# Patient Record
Sex: Male | Born: 1945 | Race: White | Hispanic: No | Marital: Married | Smoking: Former smoker
Health system: Southern US, Community
[De-identification: ages and names within clinical notes are randomized; demographics above are authoritative.]

## PROBLEM LIST (undated history)

## (undated) DIAGNOSIS — C4490 Unspecified malignant neoplasm of skin, unspecified: Secondary | ICD-10-CM

## (undated) DIAGNOSIS — I2510 Atherosclerotic heart disease of native coronary artery without angina pectoris: Secondary | ICD-10-CM

## (undated) DIAGNOSIS — E119 Type 2 diabetes mellitus without complications: Secondary | ICD-10-CM

## (undated) DIAGNOSIS — Z8522 Personal history of malignant neoplasm of nasal cavities, middle ear, and accessory sinuses: Secondary | ICD-10-CM

## (undated) DIAGNOSIS — I10 Essential (primary) hypertension: Secondary | ICD-10-CM

## (undated) HISTORY — DX: Type 2 diabetes mellitus without complications: E11.9

## (undated) HISTORY — DX: Unspecified malignant neoplasm of skin, unspecified: C44.90

## (undated) HISTORY — DX: Essential (primary) hypertension: I10

## (undated) HISTORY — DX: Atherosclerotic heart disease of native coronary artery without angina pectoris: I25.10

## (undated) HISTORY — DX: Personal history of malignant neoplasm of nasal cavities, middle ear, and accessory sinuses: Z85.22

---

## 1999-07-06 ENCOUNTER — Ambulatory Visit (HOSPITAL_COMMUNITY): Admission: RE | Admit: 1999-07-06 | Discharge: 1999-07-06 | Payer: Self-pay | Admitting: Cardiology

## 2002-01-06 ENCOUNTER — Encounter: Payer: Self-pay | Admitting: *Deleted

## 2002-01-06 ENCOUNTER — Ambulatory Visit (HOSPITAL_COMMUNITY): Admission: RE | Admit: 2002-01-06 | Discharge: 2002-01-06 | Payer: Self-pay | Admitting: *Deleted

## 2002-05-21 ENCOUNTER — Inpatient Hospital Stay (HOSPITAL_COMMUNITY): Admission: EM | Admit: 2002-05-21 | Discharge: 2002-05-26 | Payer: Self-pay | Admitting: Internal Medicine

## 2002-05-23 ENCOUNTER — Encounter: Payer: Self-pay | Admitting: Internal Medicine

## 2002-05-25 ENCOUNTER — Encounter (INDEPENDENT_AMBULATORY_CARE_PROVIDER_SITE_OTHER): Payer: Self-pay | Admitting: *Deleted

## 2007-05-02 ENCOUNTER — Encounter: Payer: Self-pay | Admitting: Orthopedic Surgery

## 2007-07-29 ENCOUNTER — Ambulatory Visit: Payer: Self-pay | Admitting: Orthopedic Surgery

## 2007-07-29 DIAGNOSIS — M25519 Pain in unspecified shoulder: Secondary | ICD-10-CM | POA: Insufficient documentation

## 2007-08-13 ENCOUNTER — Encounter: Payer: Self-pay | Admitting: Orthopedic Surgery

## 2007-08-17 ENCOUNTER — Ambulatory Visit (HOSPITAL_COMMUNITY): Admission: RE | Admit: 2007-08-17 | Discharge: 2007-08-17 | Payer: Self-pay | Admitting: Neurology

## 2007-09-10 ENCOUNTER — Telehealth: Payer: Self-pay | Admitting: Orthopedic Surgery

## 2010-07-20 NOTE — Op Note (Signed)
Kenneth Parsons, Kenneth Parsons                         ACCOUNT NO.:  [id number]   MEDICAL RECORD NO.:  [id number]                   PATIENT TYPE:  INP   LOCATION:  3739                                 FACILITY:  [facility]   PHYSICIAN:  Larina Earthly, M.D.                 DATE OF BIRTH:  Jan 06, 1946   DATE OF PROCEDURE:  05/25/2002  DATE OF DISCHARGE:                                 OPERATIVE REPORT   PREOPERATIVE DIAGNOSIS:  Severe symptomatic left internal carotid artery  stenosis.   POSTOPERATIVE DIAGNOSIS:  Severe symptomatic left internal carotid artery  stenosis.   OPERATION PERFORMED:  Left carotid endarterectomy and Dacron patch  angioplasty.   SURGEON:  Larina Earthly, M.D.   ASSISTANT:  Toribio Harbour, R.N.   ANESTHESIA:  General endotracheal.   COMPLICATIONS:  None.   DISPOSITION:  To recovery room stable.   DESCRIPTION OF PROCEDURE:  The patient was taken to the operating room and  placed in supine position where the area of the left neck was prepped and  draped in the usual sterile fashion.  An incision was made anterior to the  sternocleidomastoid muscle and carried down through the platysma with  electrocautery.  The sternocleidomastoid reflected posteriorly and the  carotid sheath was opened.  The facial vein was ligated with 2-0 silk ties  and divided.  The common carotid artery was encircled below the level of the  bifurcation with an umbilical tape and Rumel tourniquet.  Dissection was  extended onto the bifurcation and the superior thyroid artery was encircled  with a 2-0 silk Pott's tie.  The external carotid artery was encircled with  a blue vessel loop and the internal carotid artery encircled with an  umbilical tape and Rumel tourniquet.  The vagus and hypoglossal nerves were  identified and preserved.  The patient was given 7000 units of intravenous  heparin.  After adequate circulation time, the internal, external and common  carotid arteries were  occluded.  The common carotid artery was opened with  an 11 blade and extended longitudinally with Pott's scissors through the  plaque onto the internal carotid artery with Pott's scissors.  A 10 shunt  was passed up the internal carotid artery, allowed to backbleed and down the  common carotid artery where it was secured with Rumel tourniquets.  Endarterectomy was begun on the common carotid artery and the plaque was  divided proximally with Pott's scissors.  Endarterectomy extended onto the  bifurcation,  External carotid artery was endarterectomized with eversion  technique and the internal carotid artery was endarterectomized in an open  fashion.  The remaining atheromatous debris was removed from the  endarterectomy plane.  A Finesse Hemashield Dacron patch was brought onto  the field and was sewn as a patch angioplasty with a running 6-0 Prolene  suture.  Prior to completion of the anastomosis, the shunt was removed and  the  usual flushing maneuvers were undertaken.  The anastomosis was completed  and the external, followed by the common, followed by the internal carotid  artery occlusion clamp was removed.  Excellent flow characteristics were  noted with hand held Doppler in the internal and external carotid arteries.  The patient was given 15 mg of protamine to reverse the heparin.  The wounds  were irrigated with saline.  Hemostasis with electrocautery.  Wounds were  closed with 3-0 Vicryl sutures to reapproximate the sternocleidomastoid  muscle over the carotid sheath.  Next, the platysma was  closed with a running 3-0 Vicryl suture and finally, the skin was closed  with a 4-0 subcuticular Vicryl stitch.  Benzoin and Steri-Strips were  applied.  The patient was awakened in the operating room and returned to the  recovery room in stable condition.                                                Larina Earthly, M.D.    TFE/MEDQ  D:  05/25/2002  T:  05/25/2002  Job:  [id number]    cc:   Pricilla Riffle, M.D. [facility]

## 2010-07-20 NOTE — Cardiovascular Report (Signed)
   NAMEVIDAL, Kenneth Parsons                         ACCOUNT NO.:  [id number]   MEDICAL RECORD NO.:  [id number]                   PATIENT TYPE:  INP   LOCATION:  3739                                 FACILITY:  [facility]   PHYSICIAN:  Charlton Haws, M.D. [facility]              DATE OF BIRTH:  Aug 06, 1945   DATE OF PROCEDURE:  DATE OF DISCHARGE:                              CARDIAC CATHETERIZATION   PROCEDURE:  Coronary arteriography.   INDICATIONS FOR PROCEDURE:  Chest pain.  Rule out angina.   PROCEDURAL NOTE:  Standard catheterization was done from the right femoral  artery.   FINDINGS:  1. The left main coronary artery was normal  2. The left anterior descending artery had 30% proximal disease.  The mid     vessel had 50% to 60% diffuse disease, particularly at the takeoff of the     first diagonal branch. The first diagonal branch had 40% ostial lesion.     The distal LAD had multiple discrete lesions.  3. The circumflex coronary artery had 30% ostial lesion.  There was a 30%     obtuse marginal branch lesion.  4. The right coronary artery was dominant.  There was a 30% mid vessel     lesion and a 40% distal lesion before the takeoff of the PDA.  5. RAO ventriculography:  Normal.  EF was greater than  60%.  There was no     gradient across the aortic valve and no MR.  6. The aortic pressure was in the range of 180/103.  LV pressure was 180/19.   IMPRESSION:  The patient should have continued medical therapy.  He has had  stable symptoms for over a year.  He will need an outpatient Cardiolite to  get a baseline in regards to his left anterior descending artery disease.   I would maximize medical therapy.  Clearly, he needs better blood pressure  control.   If his leg heals well, he will be discharged in the morning.                                               Charlton Haws, M.D. [facility]    PN/MEDQ  D:  05/24/2002  T:  05/24/2002  Job:  [id number]   cc:   Annette Stable Hasanaj  [address]  [place]  [place] [address]  Fax: [phone]   [facility]   Jonelle Sidle, M.D. [facility]   Willa Rough, M.D. [facility]

## 2010-07-20 NOTE — Cardiovascular Report (Signed)
. [facility]  Patient:    Kenneth Parsons, Kenneth Parsons                      MRN: [id number] Proc. Date: 07/06/99 Adm. Date:  [id number] Disc. Date: [id number] Attending:  Learta Codding CC:         Kenneth Parsons, M.D., Kenneth Parsons, M.D.             Kenneth Parsons Kenneth Parsons, M.D. [facility]             Rollene Rotunda, M.D. [facility]                        Cardiac Catheterization  REFERRING PHYSICIAN:  Lia Parsons, M.D.  CARDIOLOGIST:  Kenneth Parsons. Kenneth Parsons, M.D. [facility]  DATE OF BIRTH:  11-16-1945.  PROCEDURES PERFORMED: 1. Left heart catheterization. 2. Ventriculography.  DIAGNOSIS: 1. Nonobstructive coronary artery disease. 2. Normal left ventricular systolic function.  HISTORY:  Kenneth Parsons is a 65 year old white male followed by Dr. Antoine Parsons and Dr. Jens Parsons.  The patient has also a history of hypertension, hyperlipidemia, and diabetes mellitus.  The patient has been recently evaluated for increased substernal chest pain.  He underwent stress ______ on Jul 04, 1999.  He experienced chest tightness during this study and had one ______ ST depression in inferolateral leads.  Scintigraphic images, however, showed normal perfusion.  His ejection fraction was 62%.  However, due to continued pain and the fact that the patient has had positive EKG changes, he has been referred for a diagnostic cardiac catheterization.  DESCRIPTION OF PROCEDURE:  After informed consent was obtained, the patient was brought to the catheterization laboratory.  The right groin was sterilely prepped and draped.  Lidocaine, 1%, was used to infiltrate the right groin.  A 6-French arterial sheath was placed using a modified Seldinger technique. Subsequently, 6-French JL4 and JR4 catheters were used to engage the left and right coronary arteries.  Selective angiography was performed in various projections using manual injections of contrast.  After selective angiography, a  ventriculography was performed.  A 6-French pigtail catheter was placed in the left ventricle.  Appropriate left-sided hemodynamics were obtained.  A ventriculogram was obtained using power injections of contrast.  The pigtail catheter was then removed and pulled back.  At the termination of the case, the catheter and sheath were removed and manual pressure applied until adequate hemostasis was achieved.  The patient tolerated the procedure well and was transferred to the day hospital in stable condition.  FINDINGS:  Hemodynamics:  Left ventricular pressure 140/11 mmHg.  Aortic pressure 140/71 mmHg.  Ventriculography performed in single plane RAO projection:  Left ventricular ejection fraction of 65% without wall motion abnormalities.  Selective angiography: 1. The left main coronary artery is a large caliber vessel with no    flow-limiting coronary artery disease. 2. The left anterior descending artery is only a moderate sized caliber vessel    with some diffuse plaquing estimated at 20-30% in its proximal segment of    the vessel.  Mid and distal vessel is free of disease. 3. The left circumflex coronary artery has approximately a proximal 20%    stenosis with a diffuse irregularity.  There is no other flow-limiting    coronary artery disease in this distribution. 4. The right coronary artery has a 20-30% distal area of diffuse plaquing  but    otherwise no flow-limiting coronary artery disease.  CONCLUSION: 1. Nonobstructive mild coronary artery disease. 2. Normal left ventricular systolic function.  RECOMMENDATIONS:  The results have been discussed with the patient and his family.  Based on the scintigraphic images as well as the current findings by cardiac catheterization, there is no indication for surgical or percutaneous revascularization.  However, the patient does have minor plaquing in multiple distributions as outlined above, and given his risk factor profile,  the patient will need aggressive medical therapy with lipid-lowering as well as aggressive risk factor modification.  The results have been forwarded to Dr. Olena Leatherwood. DD:  07/06/99 TD:  07/08/99 Job: [id number]

## 2010-07-20 NOTE — Discharge Summary (Signed)
NAMEHURMAN, KETELSEN                         ACCOUNT NO.:  [id number]   MEDICAL RECORD NO.:  [id number]                   PATIENT TYPE:  INP   LOCATION:  3306                                 FACILITY:  [facility]   PHYSICIAN:  Jonelle Sidle, M.D. [facility]        DATE OF BIRTH:  Aug 12, 1945   DATE OF ADMISSION:  05/21/2002  DATE OF DISCHARGE:  05/26/2002                           DISCHARGE SUMMARY - REFERRING   PROCEDURES:  1. Coronary angiogram on May 24, 2002.  2. Left carotid endarterectomy on May 25, 2002.   HISTORY OF PRESENT ILLNESS:  The patient is a 65 year old male with a prior  history of nonobstructive coronary disease, who was recently referred to  Willa Rough, M.D., at our [place], cardiology clinic for  evaluation of an abnormal routine treadmill test.  Given the patient's  complaint of chest discomfort in the setting of multiple cardiac risk  factors, arrangements were made for an elective coronary angiogram.  In the  interim, he was referred for carotid Dopplers for evaluation of carotid  bruits.  These revealed high-grade left ICA lesion (80-99%).  Following  initiation of intravenous heparin, arrangements were made for direct  transfer from [facility] to [facility] for  surgical evaluation preceded by diagnostic coronary angiography.   LABORATORY DATA:  Electrolytes and renal function remained within normal  limits with discharge levels of sodium 134, potassium 3.9, glucose 156, BUN  14, and creatinine 0.9.  WBC 13.8, HGB 12.4, platelets 263 at discharge.   Chest x-ray:  Small bilateral pulmonary nodule - question old granulomatous  disease; consider follow-up chest CT to exclude neoplasm.   HOSPITAL COURSE:  Following transfer from [facility], the patient was  started on intravenous heparin for treatment of high-grade left ICA stenosis  with evidence of soft plaque/thrombus by ultrasonography.  He was evaluated  by Larina Earthly, M.D., with initial recommendation to repeat the  ultrasound.  This revealed greater than 80% ICA stenosis (on high end of  scale) with greater than 80% left ECA stenosis as well; no right ICA  stenosis.  The recommendation was to proceed with carotid endarterectomy.   This was preceded, however, by diagnostic coronary angiogram on May 24, 2002, by Charlton Haws, M.D. (see report for full details), revealing  nonobstructive coronary artery disease with normal left ventricular  function.  Specifically, there was normal LMCA; 30%  proximal; 50-60% mid,  30% distal LAD; 40% ostial diagonal 1; 30% proximal CFX; 30% OM1; 30% mid,  40% distal RCA.   Of note, the patient did have elevated blood pressure (180/103) during  procedure, requiring treatment with labetalol and intravenous Vasotec.   Charlton Haws, M.D., recommended medical therapy with consideration for  outpatient Cardiolite.   The patient was then referred for carotid endarterectomy performed on May 25, 2002, by Larina Earthly, M.D., with no noted complications.  He was kept  for overnight  observation and cleared for discharge the following morning.   The patient had no complaint of chest pain or dyspnea and was  hemodynamically stable and afebrile.  Both the left CEA and right groin  incision sites appeared stable.   Given the finding of small bilateral pulmonary nodules by routine chest x-  ray, the recommendation is to proceed with a follow-up outpatient chest CT  scan.  The patient has been made aware of this and scheduling arrangements  will be made through the [place], [place], office.   Medication adjustments this admission:  Up titration of Monopril to 10 mg  daily.   DISCHARGE MEDICATIONS:  1. Coated aspirin 325 mg daily.  2. Lipitor 20 mg daily.  3. Prozac 20 mg daily.  4. Monopril 10 mg daily.  5. Glucovance 2.5/500 mg b.i.d.  6. Nitrostat 0.4 mg as directed.   ACTIVITY:  The patient  is to refrain from any strenuous activity, driving,  or return to work until seen by his physician.   WOUND CARE:  He is to call if there is any swelling or bleeding from the  incision sites.   FOLLOW-UP:  The patient is scheduled to follow up with Willa Rough, M.D.,  at the [place], [facility], office on Thursday, June 03, 2002, at 12:15  p.m.  This will be preceded by a chest CT scan with arrangements made by our  office.   The patient is scheduled to follow up with Larina Earthly, M.D., on June 10, 2002, at 1:30 p.m.   DISCHARGE DIAGNOSES:  1. Severe left internal carotid artery stenosis.     a. Status post carotid endarterectomy on May 25, 2002.  2. Nonobstructive coronary artery disease, normal left ventricular function.     a. Coronary angiogram on May 24, 2002.     b. Abnormal routine treadmill test on May 20, 2002.     c. Minimal coronary artery disease on coronary angiogram in May of 2001.  3. Abnormal chest x-ray on May 10, 2002.  Follow-up chest CT scan     recommended.  4. Hypertension.  5. Type 2 diabetes mellitus.  6. Dyslipidemia.  7. History of melanoma.     Gene Serpe, P.A. [facility]                      Jonelle Sidle, M.D. [facility]    GS/MEDQ  D:  05/26/2002  T:  05/26/2002  Job:  [id number]   cc:   Annette Stable Hasanaj  [address]  [place]  [place] [address]  Fax: [phone]   Larina Earthly, M.D.  [address]  [place]  [place] [address]  Fax: [phone]   [facility]  [address]  [address]  [place], [place] [address]

## 2011-04-12 DIAGNOSIS — E538 Deficiency of other specified B group vitamins: Secondary | ICD-10-CM | POA: Diagnosis not present

## 2011-04-12 DIAGNOSIS — E119 Type 2 diabetes mellitus without complications: Secondary | ICD-10-CM | POA: Diagnosis not present

## 2011-04-12 DIAGNOSIS — D518 Other vitamin B12 deficiency anemias: Secondary | ICD-10-CM | POA: Diagnosis not present

## 2011-04-12 DIAGNOSIS — I10 Essential (primary) hypertension: Secondary | ICD-10-CM | POA: Diagnosis not present

## 2011-04-12 DIAGNOSIS — E785 Hyperlipidemia, unspecified: Secondary | ICD-10-CM | POA: Diagnosis not present

## 2011-05-28 DIAGNOSIS — E538 Deficiency of other specified B group vitamins: Secondary | ICD-10-CM | POA: Diagnosis not present

## 2011-06-12 DIAGNOSIS — C311 Malignant neoplasm of ethmoidal sinus: Secondary | ICD-10-CM | POA: Diagnosis not present

## 2011-07-08 DIAGNOSIS — M5030 Other cervical disc degeneration, unspecified cervical region: Secondary | ICD-10-CM | POA: Diagnosis not present

## 2011-07-08 DIAGNOSIS — M542 Cervicalgia: Secondary | ICD-10-CM | POA: Diagnosis not present

## 2011-08-07 ENCOUNTER — Other Ambulatory Visit: Payer: Self-pay | Admitting: Dermatology

## 2011-08-07 DIAGNOSIS — D0430 Carcinoma in situ of skin of unspecified part of face: Secondary | ICD-10-CM | POA: Diagnosis not present

## 2011-08-07 DIAGNOSIS — D485 Neoplasm of uncertain behavior of skin: Secondary | ICD-10-CM | POA: Diagnosis not present

## 2011-08-07 DIAGNOSIS — D2370 Other benign neoplasm of skin of unspecified lower limb, including hip: Secondary | ICD-10-CM | POA: Diagnosis not present

## 2011-08-07 DIAGNOSIS — L570 Actinic keratosis: Secondary | ICD-10-CM | POA: Diagnosis not present

## 2011-10-28 DIAGNOSIS — D0460 Carcinoma in situ of skin of unspecified upper limb, including shoulder: Secondary | ICD-10-CM | POA: Diagnosis not present

## 2011-10-28 DIAGNOSIS — D0430 Carcinoma in situ of skin of unspecified part of face: Secondary | ICD-10-CM | POA: Diagnosis not present

## 2011-11-11 DIAGNOSIS — Z9889 Other specified postprocedural states: Secondary | ICD-10-CM | POA: Diagnosis not present

## 2011-11-11 DIAGNOSIS — Z923 Personal history of irradiation: Secondary | ICD-10-CM | POA: Diagnosis not present

## 2011-11-11 DIAGNOSIS — Z8522 Personal history of malignant neoplasm of nasal cavities, middle ear, and accessory sinuses: Secondary | ICD-10-CM | POA: Diagnosis not present

## 2011-11-11 DIAGNOSIS — C311 Malignant neoplasm of ethmoidal sinus: Secondary | ICD-10-CM | POA: Diagnosis not present

## 2011-11-11 DIAGNOSIS — Z9221 Personal history of antineoplastic chemotherapy: Secondary | ICD-10-CM | POA: Diagnosis not present

## 2011-11-19 DIAGNOSIS — Z23 Encounter for immunization: Secondary | ICD-10-CM | POA: Diagnosis not present

## 2011-11-21 DIAGNOSIS — I10 Essential (primary) hypertension: Secondary | ICD-10-CM | POA: Diagnosis not present

## 2011-11-21 DIAGNOSIS — E785 Hyperlipidemia, unspecified: Secondary | ICD-10-CM | POA: Diagnosis not present

## 2011-11-21 DIAGNOSIS — E538 Deficiency of other specified B group vitamins: Secondary | ICD-10-CM | POA: Diagnosis not present

## 2011-11-21 DIAGNOSIS — IMO0001 Reserved for inherently not codable concepts without codable children: Secondary | ICD-10-CM | POA: Diagnosis not present

## 2011-11-28 ENCOUNTER — Other Ambulatory Visit (HOSPITAL_COMMUNITY): Payer: Self-pay | Admitting: Internal Medicine

## 2011-11-28 DIAGNOSIS — Z9889 Other specified postprocedural states: Secondary | ICD-10-CM

## 2011-12-02 ENCOUNTER — Ambulatory Visit (HOSPITAL_COMMUNITY)
Admission: RE | Admit: 2011-12-02 | Discharge: 2011-12-02 | Disposition: A | Discharge status: Home / Self Care | Payer: Medicare Other | Source: Ambulatory Visit | Attending: Internal Medicine | Admitting: Internal Medicine

## 2011-12-02 DIAGNOSIS — Z87891 Personal history of nicotine dependence: Secondary | ICD-10-CM | POA: Insufficient documentation

## 2011-12-02 DIAGNOSIS — Z9889 Other specified postprocedural states: Secondary | ICD-10-CM

## 2011-12-02 DIAGNOSIS — I6529 Occlusion and stenosis of unspecified carotid artery: Secondary | ICD-10-CM | POA: Diagnosis not present

## 2011-12-02 DIAGNOSIS — I10 Essential (primary) hypertension: Secondary | ICD-10-CM | POA: Diagnosis not present

## 2011-12-02 DIAGNOSIS — E119 Type 2 diabetes mellitus without complications: Secondary | ICD-10-CM | POA: Diagnosis not present

## 2011-12-05 ENCOUNTER — Other Ambulatory Visit: Payer: Self-pay | Admitting: Dermatology

## 2011-12-05 DIAGNOSIS — C4370 Malignant melanoma of unspecified lower limb, including hip: Secondary | ICD-10-CM | POA: Diagnosis not present

## 2011-12-05 DIAGNOSIS — D485 Neoplasm of uncertain behavior of skin: Secondary | ICD-10-CM | POA: Diagnosis not present

## 2011-12-19 DIAGNOSIS — L089 Local infection of the skin and subcutaneous tissue, unspecified: Secondary | ICD-10-CM | POA: Diagnosis not present

## 2011-12-19 DIAGNOSIS — A499 Bacterial infection, unspecified: Secondary | ICD-10-CM | POA: Diagnosis not present

## 2012-02-20 DIAGNOSIS — I10 Essential (primary) hypertension: Secondary | ICD-10-CM | POA: Diagnosis not present

## 2012-02-20 DIAGNOSIS — N400 Enlarged prostate without lower urinary tract symptoms: Secondary | ICD-10-CM | POA: Diagnosis not present

## 2012-02-20 DIAGNOSIS — E119 Type 2 diabetes mellitus without complications: Secondary | ICD-10-CM | POA: Diagnosis not present

## 2012-02-20 DIAGNOSIS — E785 Hyperlipidemia, unspecified: Secondary | ICD-10-CM | POA: Diagnosis not present

## 2012-02-20 DIAGNOSIS — B029 Zoster without complications: Secondary | ICD-10-CM | POA: Diagnosis not present

## 2012-05-13 DIAGNOSIS — Z8522 Personal history of malignant neoplasm of nasal cavities, middle ear, and accessory sinuses: Secondary | ICD-10-CM | POA: Diagnosis not present

## 2012-05-13 DIAGNOSIS — C311 Malignant neoplasm of ethmoidal sinus: Secondary | ICD-10-CM | POA: Diagnosis not present

## 2012-06-01 DIAGNOSIS — Z23 Encounter for immunization: Secondary | ICD-10-CM | POA: Diagnosis not present

## 2012-07-01 DIAGNOSIS — E785 Hyperlipidemia, unspecified: Secondary | ICD-10-CM | POA: Diagnosis not present

## 2012-07-01 DIAGNOSIS — I10 Essential (primary) hypertension: Secondary | ICD-10-CM | POA: Diagnosis not present

## 2012-07-01 DIAGNOSIS — Z0000 Encounter for general adult medical examination without abnormal findings: Secondary | ICD-10-CM | POA: Diagnosis not present

## 2012-07-01 DIAGNOSIS — E119 Type 2 diabetes mellitus without complications: Secondary | ICD-10-CM | POA: Diagnosis not present

## 2012-07-01 DIAGNOSIS — IMO0001 Reserved for inherently not codable concepts without codable children: Secondary | ICD-10-CM | POA: Diagnosis not present

## 2012-07-21 DIAGNOSIS — H5200 Hypermetropia, unspecified eye: Secondary | ICD-10-CM | POA: Diagnosis not present

## 2012-07-21 DIAGNOSIS — E119 Type 2 diabetes mellitus without complications: Secondary | ICD-10-CM | POA: Diagnosis not present

## 2012-08-20 DIAGNOSIS — F329 Major depressive disorder, single episode, unspecified: Secondary | ICD-10-CM | POA: Diagnosis not present

## 2012-08-20 DIAGNOSIS — M542 Cervicalgia: Secondary | ICD-10-CM | POA: Diagnosis not present

## 2012-09-30 DIAGNOSIS — E785 Hyperlipidemia, unspecified: Secondary | ICD-10-CM | POA: Diagnosis not present

## 2012-09-30 DIAGNOSIS — E119 Type 2 diabetes mellitus without complications: Secondary | ICD-10-CM | POA: Diagnosis not present

## 2012-09-30 DIAGNOSIS — I10 Essential (primary) hypertension: Secondary | ICD-10-CM | POA: Diagnosis not present

## 2012-09-30 DIAGNOSIS — R42 Dizziness and giddiness: Secondary | ICD-10-CM | POA: Diagnosis not present

## 2012-09-30 DIAGNOSIS — IMO0001 Reserved for inherently not codable concepts without codable children: Secondary | ICD-10-CM | POA: Diagnosis not present

## 2012-11-11 DIAGNOSIS — Z23 Encounter for immunization: Secondary | ICD-10-CM | POA: Diagnosis not present

## 2012-11-24 ENCOUNTER — Other Ambulatory Visit: Payer: Self-pay | Admitting: Dermatology

## 2012-11-24 DIAGNOSIS — C44621 Squamous cell carcinoma of skin of unspecified upper limb, including shoulder: Secondary | ICD-10-CM | POA: Diagnosis not present

## 2012-11-24 DIAGNOSIS — L570 Actinic keratosis: Secondary | ICD-10-CM | POA: Diagnosis not present

## 2012-11-24 DIAGNOSIS — D485 Neoplasm of uncertain behavior of skin: Secondary | ICD-10-CM | POA: Diagnosis not present

## 2013-01-06 DIAGNOSIS — I10 Essential (primary) hypertension: Secondary | ICD-10-CM | POA: Diagnosis not present

## 2013-01-06 DIAGNOSIS — Z125 Encounter for screening for malignant neoplasm of prostate: Secondary | ICD-10-CM | POA: Diagnosis not present

## 2013-01-06 DIAGNOSIS — E785 Hyperlipidemia, unspecified: Secondary | ICD-10-CM | POA: Diagnosis not present

## 2013-01-06 DIAGNOSIS — E119 Type 2 diabetes mellitus without complications: Secondary | ICD-10-CM | POA: Diagnosis not present

## 2013-01-13 DIAGNOSIS — E785 Hyperlipidemia, unspecified: Secondary | ICD-10-CM | POA: Diagnosis not present

## 2013-01-13 DIAGNOSIS — L570 Actinic keratosis: Secondary | ICD-10-CM | POA: Diagnosis not present

## 2013-01-13 DIAGNOSIS — IMO0001 Reserved for inherently not codable concepts without codable children: Secondary | ICD-10-CM | POA: Diagnosis not present

## 2013-01-13 DIAGNOSIS — I10 Essential (primary) hypertension: Secondary | ICD-10-CM | POA: Diagnosis not present

## 2013-04-07 DIAGNOSIS — E119 Type 2 diabetes mellitus without complications: Secondary | ICD-10-CM | POA: Diagnosis not present

## 2013-04-07 DIAGNOSIS — I10 Essential (primary) hypertension: Secondary | ICD-10-CM | POA: Diagnosis not present

## 2013-04-07 DIAGNOSIS — E785 Hyperlipidemia, unspecified: Secondary | ICD-10-CM | POA: Diagnosis not present

## 2013-05-10 DIAGNOSIS — IMO0001 Reserved for inherently not codable concepts without codable children: Secondary | ICD-10-CM | POA: Diagnosis not present

## 2013-05-10 DIAGNOSIS — I10 Essential (primary) hypertension: Secondary | ICD-10-CM | POA: Diagnosis not present

## 2013-05-10 DIAGNOSIS — E785 Hyperlipidemia, unspecified: Secondary | ICD-10-CM | POA: Diagnosis not present

## 2013-08-14 IMAGING — US US CAROTID DUPLEX BILAT
1 series · 13 of 24 positions shown · non-contrast
Comparison: None available

CLINICAL DATA: Previous left carotid endarterectomy.  Hypertension,
diabetes, previous tobacco use.

BILATERAL CAROTID DUPLEX ULTRASOUND
TECHNIQUE: Gray scale imaging, color Doppler and duplex ultrasound
was performed of bilateral carotid and vertebral arteries in the
neck.

[Series 1: us carotid duplex bilat · 0.06mm/px · 13 of 78 slices shown]
[im 1/78]
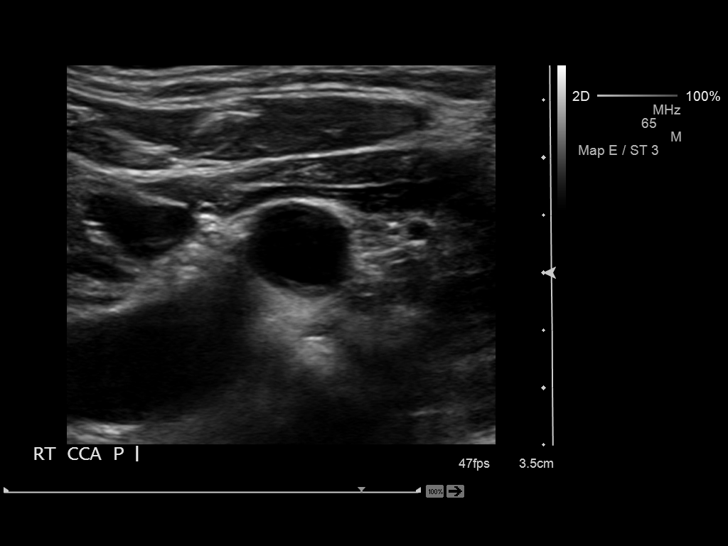
[im 7/78]
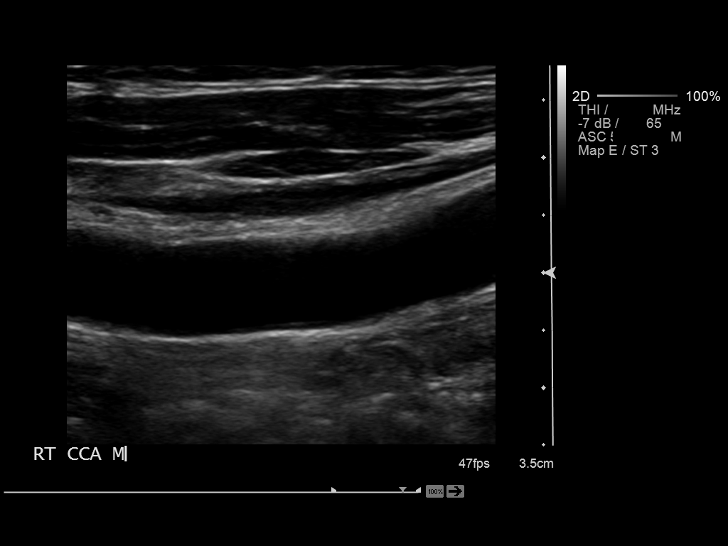
[im 14/78]
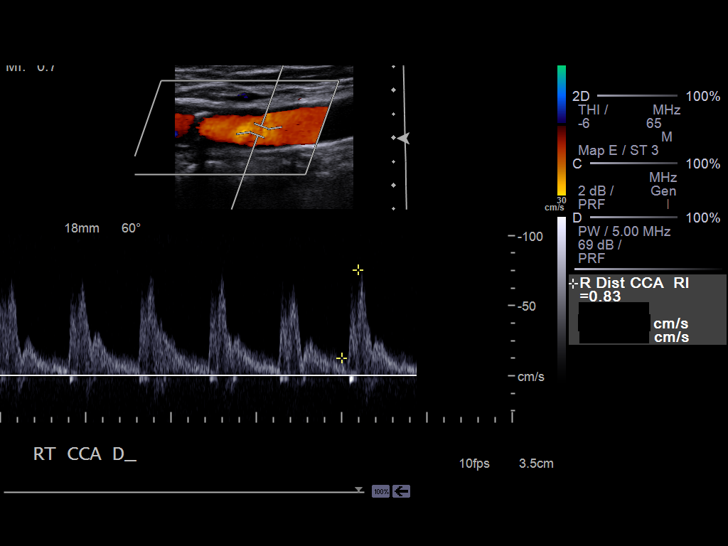
[im 21/78]
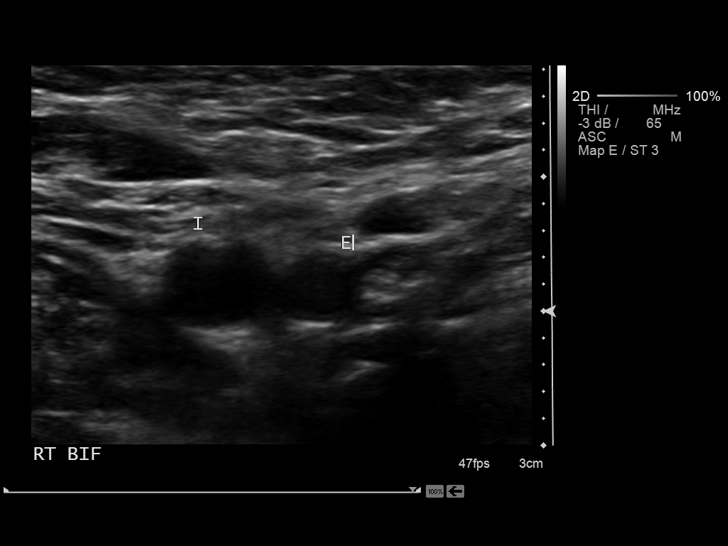
[im 27/78]
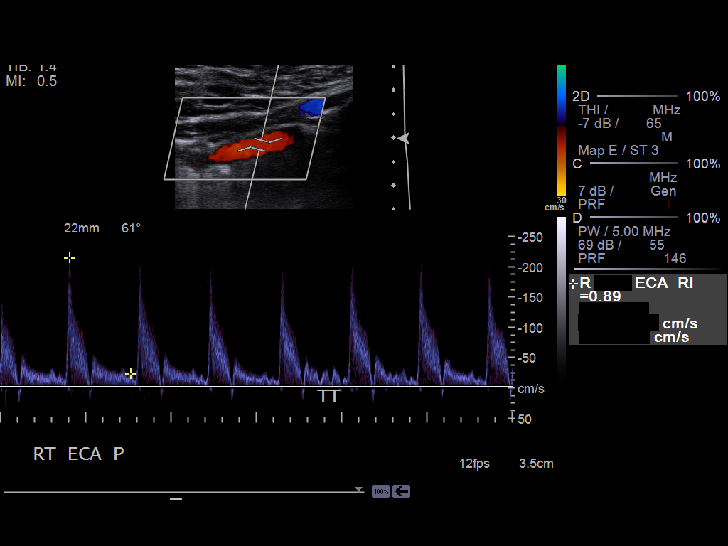
[im 34/78]
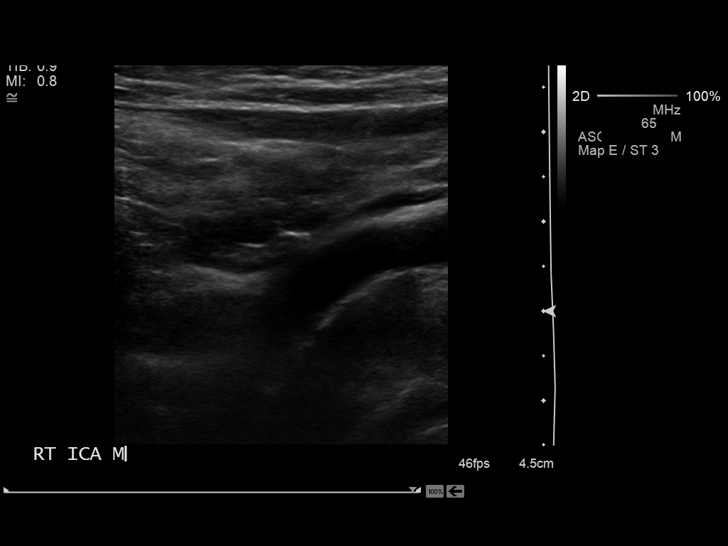
[im 41/78]
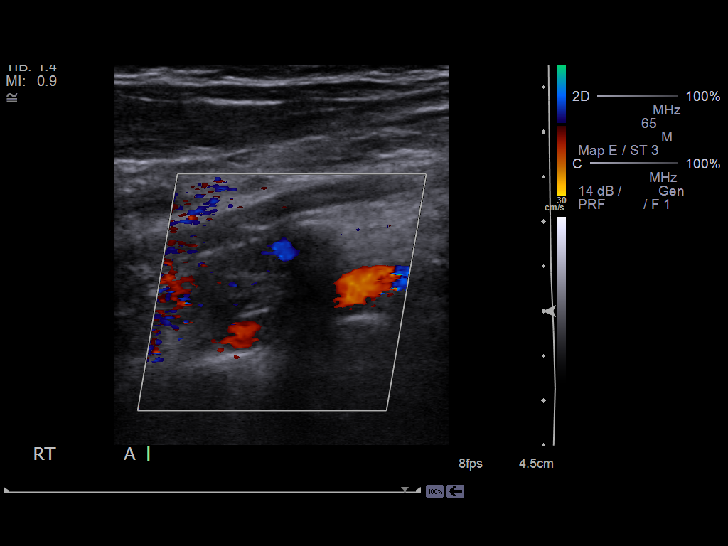
[im 44/78]
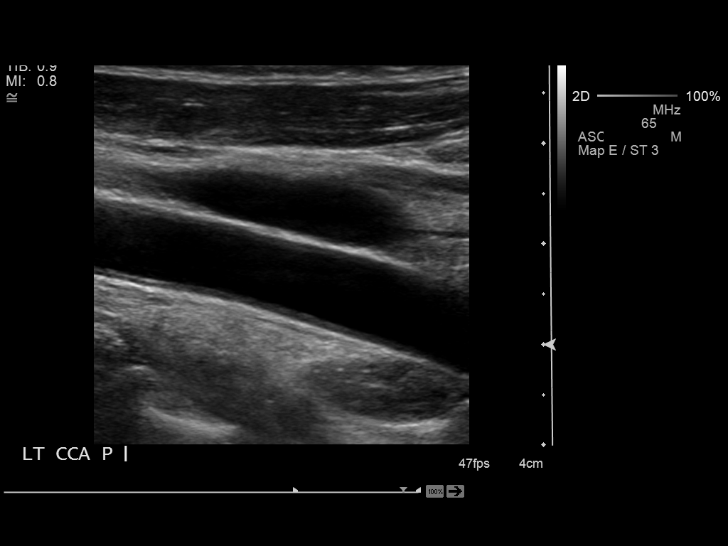
[im 51/78]
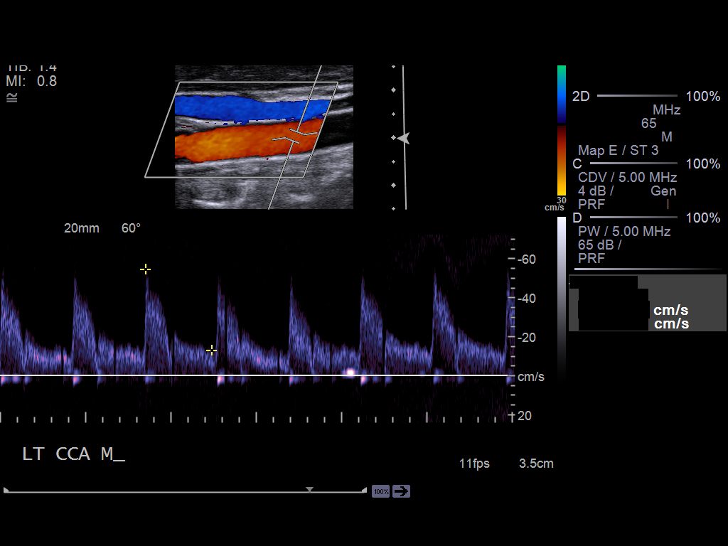
[im 57/78]
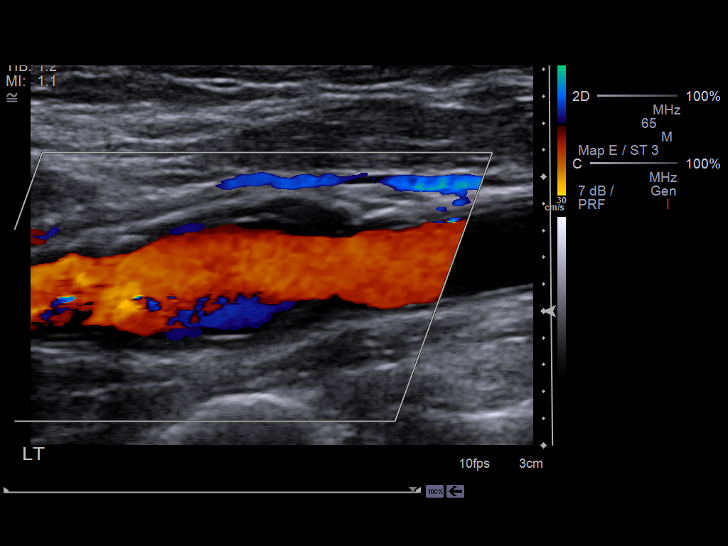
[im 64/78]
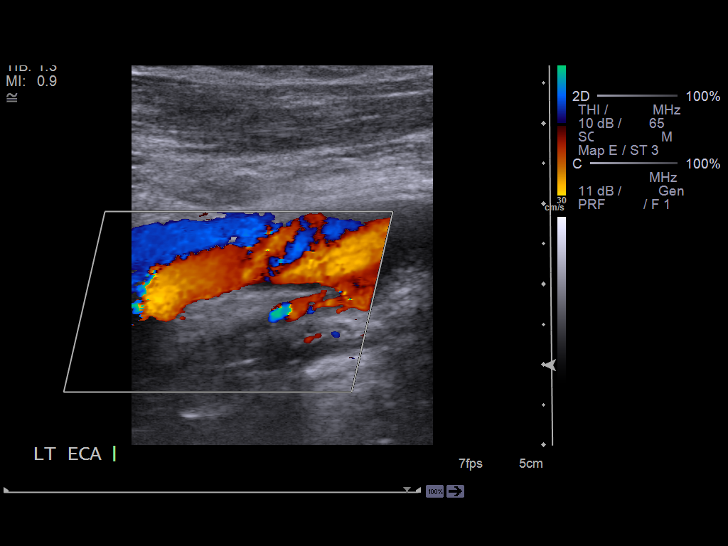
[im 71/78]
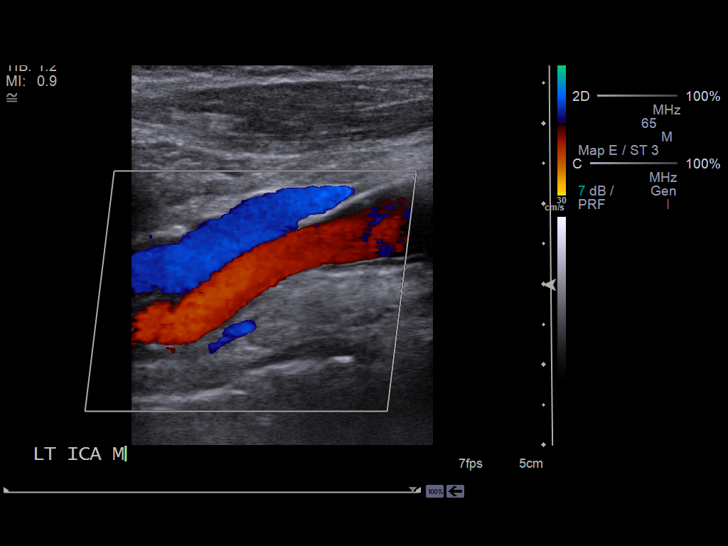
[im 78/78]
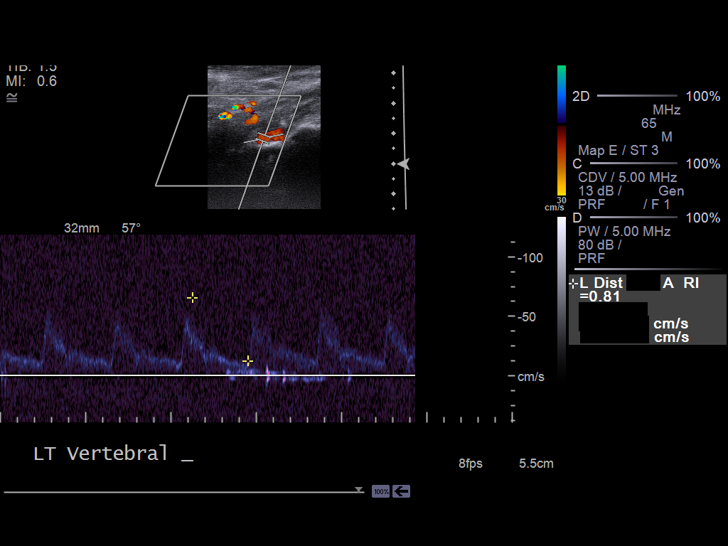

[13 of 24 positions shown; findings below may reference images not displayed]

Criteria:  Quantification of carotid stenosis is based on velocity
parameters that correlate the residual internal carotid diameter
with NASCET-based stenosis levels, using the diameter of the distal
internal carotid lumen as the denominator for stenosis measurement.

The following velocity measurements were obtained:

                 PEAK SYSTOLIC/END DIASTOLIC
RIGHT
ICA:                        89/23cm/sec
CCA:                        87/14cm/sec
SYSTOLIC ICA/CCA RATIO:
DIASTOLIC ICA/CCA RATIO:
ECA:                        284cm/sec

LEFT
ICA:                        79/15cm/sec
CCA:                        57/17cm/sec
SYSTOLIC ICA/CCA RATIO:
DIASTOLIC ICA/CCA RATIO:
ECA:                        78cm/sec
FINDINGS: RIGHT CAROTID ARTERY: There is mild smooth noncalcified plaque in
the common carotid artery.  There is circumferential somewhat
irregular and partially calcified plaque in the carotid bulb
extending to the proximal ICA.  Plaque extends to the origin of the
external carotid artery with mildly elevated peak systolic
velocities.  Normal wave forms and color Doppler signal.

RIGHT VERTEBRAL ARTERY:  Normal flow direction and waveform.

LEFT CAROTID ARTERY: Mild intimal thickening in the common carotid
artery.  The carotid bulb appears capacious with mild somewhat
irregular wall thickening extending to the proximal ICA.  No focal
plaque accumulation or stenosis.  Normal wave forms and color
Doppler signal.

LEFT VERTEBRAL ARTERY:  Normal flow direction and waveform.
IMPRESSION: 1.  Mild right carotid bifurcation plaque resulting in less than
50% diameter stenosis. The exam does not exclude plaque ulceration
or embolization.  Continued surveillance recommended.

2.  Changes of left carotid endarterectomy without apparent
complication or significant residual/recurrent stenosis.

## 2013-09-28 DIAGNOSIS — E785 Hyperlipidemia, unspecified: Secondary | ICD-10-CM | POA: Diagnosis not present

## 2013-09-28 DIAGNOSIS — R0602 Shortness of breath: Secondary | ICD-10-CM | POA: Diagnosis not present

## 2013-09-28 DIAGNOSIS — I10 Essential (primary) hypertension: Secondary | ICD-10-CM | POA: Diagnosis not present

## 2013-09-28 DIAGNOSIS — E119 Type 2 diabetes mellitus without complications: Secondary | ICD-10-CM | POA: Diagnosis not present

## 2013-12-22 DIAGNOSIS — Z8522 Personal history of malignant neoplasm of nasal cavities, middle ear, and accessory sinuses: Secondary | ICD-10-CM | POA: Diagnosis not present

## 2013-12-22 DIAGNOSIS — Z8520 Personal history of malignant neoplasm of unspecified respiratory organ: Secondary | ICD-10-CM | POA: Diagnosis not present

## 2013-12-23 DIAGNOSIS — Z23 Encounter for immunization: Secondary | ICD-10-CM | POA: Diagnosis not present

## 2014-01-10 DIAGNOSIS — Z1289 Encounter for screening for malignant neoplasm of other sites: Secondary | ICD-10-CM | POA: Diagnosis not present

## 2014-01-10 DIAGNOSIS — C311 Malignant neoplasm of ethmoidal sinus: Secondary | ICD-10-CM | POA: Diagnosis not present

## 2014-01-10 DIAGNOSIS — Z0389 Encounter for observation for other suspected diseases and conditions ruled out: Secondary | ICD-10-CM | POA: Diagnosis not present

## 2014-02-02 DIAGNOSIS — I10 Essential (primary) hypertension: Secondary | ICD-10-CM | POA: Diagnosis not present

## 2014-02-02 DIAGNOSIS — E119 Type 2 diabetes mellitus without complications: Secondary | ICD-10-CM | POA: Diagnosis not present

## 2014-02-02 DIAGNOSIS — E1165 Type 2 diabetes mellitus with hyperglycemia: Secondary | ICD-10-CM | POA: Diagnosis not present

## 2014-02-02 DIAGNOSIS — E785 Hyperlipidemia, unspecified: Secondary | ICD-10-CM | POA: Diagnosis not present

## 2014-02-02 DIAGNOSIS — I2510 Atherosclerotic heart disease of native coronary artery without angina pectoris: Secondary | ICD-10-CM | POA: Diagnosis not present

## 2014-04-08 DIAGNOSIS — R21 Rash and other nonspecific skin eruption: Secondary | ICD-10-CM | POA: Diagnosis not present

## 2014-04-08 DIAGNOSIS — D235 Other benign neoplasm of skin of trunk: Secondary | ICD-10-CM | POA: Diagnosis not present

## 2014-04-08 DIAGNOSIS — D225 Melanocytic nevi of trunk: Secondary | ICD-10-CM | POA: Diagnosis not present

## 2014-04-08 DIAGNOSIS — D2272 Melanocytic nevi of left lower limb, including hip: Secondary | ICD-10-CM | POA: Diagnosis not present

## 2014-06-16 DIAGNOSIS — E780 Pure hypercholesterolemia: Secondary | ICD-10-CM | POA: Diagnosis not present

## 2014-06-16 DIAGNOSIS — I10 Essential (primary) hypertension: Secondary | ICD-10-CM | POA: Diagnosis not present

## 2014-06-16 DIAGNOSIS — Z125 Encounter for screening for malignant neoplasm of prostate: Secondary | ICD-10-CM | POA: Diagnosis not present

## 2014-06-16 DIAGNOSIS — C439 Malignant melanoma of skin, unspecified: Secondary | ICD-10-CM | POA: Diagnosis not present

## 2014-06-16 DIAGNOSIS — C319 Malignant neoplasm of accessory sinus, unspecified: Secondary | ICD-10-CM | POA: Diagnosis not present

## 2014-06-16 DIAGNOSIS — Z1211 Encounter for screening for malignant neoplasm of colon: Secondary | ICD-10-CM | POA: Diagnosis not present

## 2014-06-16 DIAGNOSIS — E119 Type 2 diabetes mellitus without complications: Secondary | ICD-10-CM | POA: Diagnosis not present

## 2014-06-29 DIAGNOSIS — Z125 Encounter for screening for malignant neoplasm of prostate: Secondary | ICD-10-CM | POA: Diagnosis not present

## 2014-06-29 DIAGNOSIS — I10 Essential (primary) hypertension: Secondary | ICD-10-CM | POA: Diagnosis not present

## 2014-06-29 DIAGNOSIS — E119 Type 2 diabetes mellitus without complications: Secondary | ICD-10-CM | POA: Diagnosis not present

## 2014-06-29 DIAGNOSIS — E780 Pure hypercholesterolemia: Secondary | ICD-10-CM | POA: Diagnosis not present

## 2014-07-05 DIAGNOSIS — D0421 Carcinoma in situ of skin of right ear and external auricular canal: Secondary | ICD-10-CM | POA: Diagnosis not present

## 2014-07-05 DIAGNOSIS — C44622 Squamous cell carcinoma of skin of right upper limb, including shoulder: Secondary | ICD-10-CM | POA: Diagnosis not present

## 2014-07-05 DIAGNOSIS — D485 Neoplasm of uncertain behavior of skin: Secondary | ICD-10-CM | POA: Diagnosis not present

## 2014-07-05 DIAGNOSIS — L570 Actinic keratosis: Secondary | ICD-10-CM | POA: Diagnosis not present

## 2014-07-12 DIAGNOSIS — D0421 Carcinoma in situ of skin of right ear and external auricular canal: Secondary | ICD-10-CM | POA: Diagnosis not present

## 2014-07-12 DIAGNOSIS — C44222 Squamous cell carcinoma of skin of right ear and external auricular canal: Secondary | ICD-10-CM | POA: Diagnosis not present

## 2014-07-19 DIAGNOSIS — C44629 Squamous cell carcinoma of skin of left upper limb, including shoulder: Secondary | ICD-10-CM | POA: Diagnosis not present

## 2014-07-19 DIAGNOSIS — L570 Actinic keratosis: Secondary | ICD-10-CM | POA: Diagnosis not present

## 2014-08-29 DIAGNOSIS — R1013 Epigastric pain: Secondary | ICD-10-CM | POA: Diagnosis not present

## 2014-08-29 DIAGNOSIS — E1169 Type 2 diabetes mellitus with other specified complication: Secondary | ICD-10-CM | POA: Diagnosis not present

## 2014-09-13 DIAGNOSIS — I10 Essential (primary) hypertension: Secondary | ICD-10-CM | POA: Diagnosis not present

## 2014-09-13 DIAGNOSIS — G629 Polyneuropathy, unspecified: Secondary | ICD-10-CM | POA: Diagnosis not present

## 2014-09-13 DIAGNOSIS — Z8522 Personal history of malignant neoplasm of nasal cavities, middle ear, and accessory sinuses: Secondary | ICD-10-CM | POA: Diagnosis not present

## 2014-09-13 DIAGNOSIS — E780 Pure hypercholesterolemia: Secondary | ICD-10-CM | POA: Diagnosis not present

## 2014-09-13 DIAGNOSIS — E119 Type 2 diabetes mellitus without complications: Secondary | ICD-10-CM | POA: Diagnosis not present

## 2014-09-13 DIAGNOSIS — Z1211 Encounter for screening for malignant neoplasm of colon: Secondary | ICD-10-CM | POA: Diagnosis not present

## 2014-09-13 DIAGNOSIS — Z85820 Personal history of malignant melanoma of skin: Secondary | ICD-10-CM | POA: Diagnosis not present

## 2014-10-31 DIAGNOSIS — E1169 Type 2 diabetes mellitus with other specified complication: Secondary | ICD-10-CM | POA: Diagnosis not present

## 2014-10-31 DIAGNOSIS — I10 Essential (primary) hypertension: Secondary | ICD-10-CM | POA: Diagnosis not present

## 2014-10-31 DIAGNOSIS — R1013 Epigastric pain: Secondary | ICD-10-CM | POA: Diagnosis not present

## 2014-11-08 DIAGNOSIS — H35033 Hypertensive retinopathy, bilateral: Secondary | ICD-10-CM | POA: Diagnosis not present

## 2014-11-08 DIAGNOSIS — H40033 Anatomical narrow angle, bilateral: Secondary | ICD-10-CM | POA: Diagnosis not present

## 2014-11-08 DIAGNOSIS — E119 Type 2 diabetes mellitus without complications: Secondary | ICD-10-CM | POA: Diagnosis not present

## 2014-11-21 DIAGNOSIS — E1169 Type 2 diabetes mellitus with other specified complication: Secondary | ICD-10-CM | POA: Diagnosis not present

## 2014-11-21 DIAGNOSIS — R1013 Epigastric pain: Secondary | ICD-10-CM | POA: Diagnosis not present

## 2014-12-19 DIAGNOSIS — E1169 Type 2 diabetes mellitus with other specified complication: Secondary | ICD-10-CM | POA: Diagnosis not present

## 2014-12-19 DIAGNOSIS — R1013 Epigastric pain: Secondary | ICD-10-CM | POA: Diagnosis not present

## 2014-12-19 DIAGNOSIS — I10 Essential (primary) hypertension: Secondary | ICD-10-CM | POA: Diagnosis not present

## 2014-12-24 DIAGNOSIS — Z23 Encounter for immunization: Secondary | ICD-10-CM | POA: Diagnosis not present

## 2014-12-26 DIAGNOSIS — Z0389 Encounter for observation for other suspected diseases and conditions ruled out: Secondary | ICD-10-CM | POA: Diagnosis not present

## 2014-12-26 DIAGNOSIS — Z08 Encounter for follow-up examination after completed treatment for malignant neoplasm: Secondary | ICD-10-CM | POA: Diagnosis not present

## 2014-12-26 DIAGNOSIS — Z9221 Personal history of antineoplastic chemotherapy: Secondary | ICD-10-CM | POA: Diagnosis not present

## 2014-12-26 DIAGNOSIS — Z8522 Personal history of malignant neoplasm of nasal cavities, middle ear, and accessory sinuses: Secondary | ICD-10-CM | POA: Diagnosis not present

## 2014-12-26 DIAGNOSIS — C311 Malignant neoplasm of ethmoidal sinus: Secondary | ICD-10-CM | POA: Diagnosis not present

## 2014-12-26 DIAGNOSIS — R0602 Shortness of breath: Secondary | ICD-10-CM | POA: Diagnosis not present

## 2015-01-13 DIAGNOSIS — M9901 Segmental and somatic dysfunction of cervical region: Secondary | ICD-10-CM | POA: Diagnosis not present

## 2015-01-13 DIAGNOSIS — M542 Cervicalgia: Secondary | ICD-10-CM | POA: Diagnosis not present

## 2015-01-13 DIAGNOSIS — M9902 Segmental and somatic dysfunction of thoracic region: Secondary | ICD-10-CM | POA: Diagnosis not present

## 2015-01-13 DIAGNOSIS — M546 Pain in thoracic spine: Secondary | ICD-10-CM | POA: Diagnosis not present

## 2015-01-17 DIAGNOSIS — M542 Cervicalgia: Secondary | ICD-10-CM | POA: Diagnosis not present

## 2015-01-17 DIAGNOSIS — M546 Pain in thoracic spine: Secondary | ICD-10-CM | POA: Diagnosis not present

## 2015-01-17 DIAGNOSIS — M9902 Segmental and somatic dysfunction of thoracic region: Secondary | ICD-10-CM | POA: Diagnosis not present

## 2015-01-17 DIAGNOSIS — M9901 Segmental and somatic dysfunction of cervical region: Secondary | ICD-10-CM | POA: Diagnosis not present

## 2015-01-19 DIAGNOSIS — M542 Cervicalgia: Secondary | ICD-10-CM | POA: Diagnosis not present

## 2015-01-19 DIAGNOSIS — M9902 Segmental and somatic dysfunction of thoracic region: Secondary | ICD-10-CM | POA: Diagnosis not present

## 2015-01-19 DIAGNOSIS — M9901 Segmental and somatic dysfunction of cervical region: Secondary | ICD-10-CM | POA: Diagnosis not present

## 2015-01-19 DIAGNOSIS — M546 Pain in thoracic spine: Secondary | ICD-10-CM | POA: Diagnosis not present

## 2015-01-20 DIAGNOSIS — M9901 Segmental and somatic dysfunction of cervical region: Secondary | ICD-10-CM | POA: Diagnosis not present

## 2015-01-20 DIAGNOSIS — M9902 Segmental and somatic dysfunction of thoracic region: Secondary | ICD-10-CM | POA: Diagnosis not present

## 2015-01-20 DIAGNOSIS — M542 Cervicalgia: Secondary | ICD-10-CM | POA: Diagnosis not present

## 2015-01-20 DIAGNOSIS — M546 Pain in thoracic spine: Secondary | ICD-10-CM | POA: Diagnosis not present

## 2015-01-23 DIAGNOSIS — M542 Cervicalgia: Secondary | ICD-10-CM | POA: Diagnosis not present

## 2015-01-23 DIAGNOSIS — M9901 Segmental and somatic dysfunction of cervical region: Secondary | ICD-10-CM | POA: Diagnosis not present

## 2015-01-23 DIAGNOSIS — M9902 Segmental and somatic dysfunction of thoracic region: Secondary | ICD-10-CM | POA: Diagnosis not present

## 2015-01-23 DIAGNOSIS — M546 Pain in thoracic spine: Secondary | ICD-10-CM | POA: Diagnosis not present

## 2015-01-24 DIAGNOSIS — M546 Pain in thoracic spine: Secondary | ICD-10-CM | POA: Diagnosis not present

## 2015-01-24 DIAGNOSIS — M9902 Segmental and somatic dysfunction of thoracic region: Secondary | ICD-10-CM | POA: Diagnosis not present

## 2015-01-24 DIAGNOSIS — M542 Cervicalgia: Secondary | ICD-10-CM | POA: Diagnosis not present

## 2015-01-24 DIAGNOSIS — M9901 Segmental and somatic dysfunction of cervical region: Secondary | ICD-10-CM | POA: Diagnosis not present

## 2015-01-30 DIAGNOSIS — M1990 Unspecified osteoarthritis, unspecified site: Secondary | ICD-10-CM | POA: Diagnosis not present

## 2015-01-30 DIAGNOSIS — E1169 Type 2 diabetes mellitus with other specified complication: Secondary | ICD-10-CM | POA: Diagnosis not present

## 2015-01-30 DIAGNOSIS — E7800 Pure hypercholesterolemia, unspecified: Secondary | ICD-10-CM | POA: Diagnosis not present

## 2015-01-31 DIAGNOSIS — M542 Cervicalgia: Secondary | ICD-10-CM | POA: Diagnosis not present

## 2015-01-31 DIAGNOSIS — M9902 Segmental and somatic dysfunction of thoracic region: Secondary | ICD-10-CM | POA: Diagnosis not present

## 2015-01-31 DIAGNOSIS — M546 Pain in thoracic spine: Secondary | ICD-10-CM | POA: Diagnosis not present

## 2015-01-31 DIAGNOSIS — M9901 Segmental and somatic dysfunction of cervical region: Secondary | ICD-10-CM | POA: Diagnosis not present

## 2015-02-02 DIAGNOSIS — M542 Cervicalgia: Secondary | ICD-10-CM | POA: Diagnosis not present

## 2015-02-02 DIAGNOSIS — M9901 Segmental and somatic dysfunction of cervical region: Secondary | ICD-10-CM | POA: Diagnosis not present

## 2015-02-02 DIAGNOSIS — M546 Pain in thoracic spine: Secondary | ICD-10-CM | POA: Diagnosis not present

## 2015-02-02 DIAGNOSIS — M9902 Segmental and somatic dysfunction of thoracic region: Secondary | ICD-10-CM | POA: Diagnosis not present

## 2015-02-09 DIAGNOSIS — M9901 Segmental and somatic dysfunction of cervical region: Secondary | ICD-10-CM | POA: Diagnosis not present

## 2015-02-09 DIAGNOSIS — M9902 Segmental and somatic dysfunction of thoracic region: Secondary | ICD-10-CM | POA: Diagnosis not present

## 2015-02-09 DIAGNOSIS — M546 Pain in thoracic spine: Secondary | ICD-10-CM | POA: Diagnosis not present

## 2015-02-09 DIAGNOSIS — M542 Cervicalgia: Secondary | ICD-10-CM | POA: Diagnosis not present

## 2015-02-13 DIAGNOSIS — M9901 Segmental and somatic dysfunction of cervical region: Secondary | ICD-10-CM | POA: Diagnosis not present

## 2015-02-13 DIAGNOSIS — M542 Cervicalgia: Secondary | ICD-10-CM | POA: Diagnosis not present

## 2015-02-13 DIAGNOSIS — M546 Pain in thoracic spine: Secondary | ICD-10-CM | POA: Diagnosis not present

## 2015-02-13 DIAGNOSIS — M9902 Segmental and somatic dysfunction of thoracic region: Secondary | ICD-10-CM | POA: Diagnosis not present

## 2015-02-15 DIAGNOSIS — E7800 Pure hypercholesterolemia, unspecified: Secondary | ICD-10-CM | POA: Diagnosis not present

## 2015-02-15 DIAGNOSIS — E1169 Type 2 diabetes mellitus with other specified complication: Secondary | ICD-10-CM | POA: Diagnosis not present

## 2015-02-17 DIAGNOSIS — M546 Pain in thoracic spine: Secondary | ICD-10-CM | POA: Diagnosis not present

## 2015-02-17 DIAGNOSIS — M542 Cervicalgia: Secondary | ICD-10-CM | POA: Diagnosis not present

## 2015-02-17 DIAGNOSIS — M9902 Segmental and somatic dysfunction of thoracic region: Secondary | ICD-10-CM | POA: Diagnosis not present

## 2015-02-17 DIAGNOSIS — M9901 Segmental and somatic dysfunction of cervical region: Secondary | ICD-10-CM | POA: Diagnosis not present

## 2015-02-20 DIAGNOSIS — E1169 Type 2 diabetes mellitus with other specified complication: Secondary | ICD-10-CM | POA: Diagnosis not present

## 2015-02-20 DIAGNOSIS — M542 Cervicalgia: Secondary | ICD-10-CM | POA: Diagnosis not present

## 2015-02-20 DIAGNOSIS — M9901 Segmental and somatic dysfunction of cervical region: Secondary | ICD-10-CM | POA: Diagnosis not present

## 2015-02-20 DIAGNOSIS — M9902 Segmental and somatic dysfunction of thoracic region: Secondary | ICD-10-CM | POA: Diagnosis not present

## 2015-02-20 DIAGNOSIS — E7800 Pure hypercholesterolemia, unspecified: Secondary | ICD-10-CM | POA: Diagnosis not present

## 2015-02-20 DIAGNOSIS — I10 Essential (primary) hypertension: Secondary | ICD-10-CM | POA: Diagnosis not present

## 2015-02-20 DIAGNOSIS — M546 Pain in thoracic spine: Secondary | ICD-10-CM | POA: Diagnosis not present

## 2015-03-07 DIAGNOSIS — M546 Pain in thoracic spine: Secondary | ICD-10-CM | POA: Diagnosis not present

## 2015-03-07 DIAGNOSIS — M9901 Segmental and somatic dysfunction of cervical region: Secondary | ICD-10-CM | POA: Diagnosis not present

## 2015-03-07 DIAGNOSIS — M9902 Segmental and somatic dysfunction of thoracic region: Secondary | ICD-10-CM | POA: Diagnosis not present

## 2015-03-07 DIAGNOSIS — M542 Cervicalgia: Secondary | ICD-10-CM | POA: Diagnosis not present

## 2015-03-09 DIAGNOSIS — M542 Cervicalgia: Secondary | ICD-10-CM | POA: Diagnosis not present

## 2015-03-09 DIAGNOSIS — M9901 Segmental and somatic dysfunction of cervical region: Secondary | ICD-10-CM | POA: Diagnosis not present

## 2015-03-09 DIAGNOSIS — M546 Pain in thoracic spine: Secondary | ICD-10-CM | POA: Diagnosis not present

## 2015-03-09 DIAGNOSIS — M9902 Segmental and somatic dysfunction of thoracic region: Secondary | ICD-10-CM | POA: Diagnosis not present

## 2015-03-13 DIAGNOSIS — M9901 Segmental and somatic dysfunction of cervical region: Secondary | ICD-10-CM | POA: Diagnosis not present

## 2015-03-13 DIAGNOSIS — M9902 Segmental and somatic dysfunction of thoracic region: Secondary | ICD-10-CM | POA: Diagnosis not present

## 2015-03-13 DIAGNOSIS — M542 Cervicalgia: Secondary | ICD-10-CM | POA: Diagnosis not present

## 2015-03-13 DIAGNOSIS — M546 Pain in thoracic spine: Secondary | ICD-10-CM | POA: Diagnosis not present

## 2015-03-23 DIAGNOSIS — M9901 Segmental and somatic dysfunction of cervical region: Secondary | ICD-10-CM | POA: Diagnosis not present

## 2015-03-23 DIAGNOSIS — M546 Pain in thoracic spine: Secondary | ICD-10-CM | POA: Diagnosis not present

## 2015-03-23 DIAGNOSIS — M542 Cervicalgia: Secondary | ICD-10-CM | POA: Diagnosis not present

## 2015-03-23 DIAGNOSIS — M9902 Segmental and somatic dysfunction of thoracic region: Secondary | ICD-10-CM | POA: Diagnosis not present

## 2015-03-27 DIAGNOSIS — M546 Pain in thoracic spine: Secondary | ICD-10-CM | POA: Diagnosis not present

## 2015-03-27 DIAGNOSIS — M9901 Segmental and somatic dysfunction of cervical region: Secondary | ICD-10-CM | POA: Diagnosis not present

## 2015-03-27 DIAGNOSIS — M9902 Segmental and somatic dysfunction of thoracic region: Secondary | ICD-10-CM | POA: Diagnosis not present

## 2015-03-27 DIAGNOSIS — M542 Cervicalgia: Secondary | ICD-10-CM | POA: Diagnosis not present

## 2015-03-30 DIAGNOSIS — M9902 Segmental and somatic dysfunction of thoracic region: Secondary | ICD-10-CM | POA: Diagnosis not present

## 2015-03-30 DIAGNOSIS — M9901 Segmental and somatic dysfunction of cervical region: Secondary | ICD-10-CM | POA: Diagnosis not present

## 2015-03-30 DIAGNOSIS — M546 Pain in thoracic spine: Secondary | ICD-10-CM | POA: Diagnosis not present

## 2015-03-30 DIAGNOSIS — M542 Cervicalgia: Secondary | ICD-10-CM | POA: Diagnosis not present

## 2015-04-06 DIAGNOSIS — M542 Cervicalgia: Secondary | ICD-10-CM | POA: Diagnosis not present

## 2015-04-06 DIAGNOSIS — M9902 Segmental and somatic dysfunction of thoracic region: Secondary | ICD-10-CM | POA: Diagnosis not present

## 2015-04-06 DIAGNOSIS — M546 Pain in thoracic spine: Secondary | ICD-10-CM | POA: Diagnosis not present

## 2015-04-06 DIAGNOSIS — M9901 Segmental and somatic dysfunction of cervical region: Secondary | ICD-10-CM | POA: Diagnosis not present

## 2015-04-13 DIAGNOSIS — M546 Pain in thoracic spine: Secondary | ICD-10-CM | POA: Diagnosis not present

## 2015-04-13 DIAGNOSIS — M9902 Segmental and somatic dysfunction of thoracic region: Secondary | ICD-10-CM | POA: Diagnosis not present

## 2015-04-13 DIAGNOSIS — M542 Cervicalgia: Secondary | ICD-10-CM | POA: Diagnosis not present

## 2015-04-13 DIAGNOSIS — M9901 Segmental and somatic dysfunction of cervical region: Secondary | ICD-10-CM | POA: Diagnosis not present

## 2015-04-20 DIAGNOSIS — M546 Pain in thoracic spine: Secondary | ICD-10-CM | POA: Diagnosis not present

## 2015-04-20 DIAGNOSIS — M542 Cervicalgia: Secondary | ICD-10-CM | POA: Diagnosis not present

## 2015-04-20 DIAGNOSIS — M9901 Segmental and somatic dysfunction of cervical region: Secondary | ICD-10-CM | POA: Diagnosis not present

## 2015-04-20 DIAGNOSIS — M9902 Segmental and somatic dysfunction of thoracic region: Secondary | ICD-10-CM | POA: Diagnosis not present

## 2015-04-24 DIAGNOSIS — R1013 Epigastric pain: Secondary | ICD-10-CM | POA: Diagnosis not present

## 2015-04-24 DIAGNOSIS — E1169 Type 2 diabetes mellitus with other specified complication: Secondary | ICD-10-CM | POA: Diagnosis not present

## 2015-04-24 DIAGNOSIS — M9902 Segmental and somatic dysfunction of thoracic region: Secondary | ICD-10-CM | POA: Diagnosis not present

## 2015-04-24 DIAGNOSIS — C439 Malignant melanoma of skin, unspecified: Secondary | ICD-10-CM | POA: Diagnosis not present

## 2015-04-24 DIAGNOSIS — M546 Pain in thoracic spine: Secondary | ICD-10-CM | POA: Diagnosis not present

## 2015-04-24 DIAGNOSIS — M542 Cervicalgia: Secondary | ICD-10-CM | POA: Diagnosis not present

## 2015-04-24 DIAGNOSIS — M9901 Segmental and somatic dysfunction of cervical region: Secondary | ICD-10-CM | POA: Diagnosis not present

## 2015-04-27 DIAGNOSIS — M546 Pain in thoracic spine: Secondary | ICD-10-CM | POA: Diagnosis not present

## 2015-04-27 DIAGNOSIS — M542 Cervicalgia: Secondary | ICD-10-CM | POA: Diagnosis not present

## 2015-04-27 DIAGNOSIS — M9902 Segmental and somatic dysfunction of thoracic region: Secondary | ICD-10-CM | POA: Diagnosis not present

## 2015-04-27 DIAGNOSIS — M9901 Segmental and somatic dysfunction of cervical region: Secondary | ICD-10-CM | POA: Diagnosis not present

## 2015-05-08 DIAGNOSIS — M546 Pain in thoracic spine: Secondary | ICD-10-CM | POA: Diagnosis not present

## 2015-05-08 DIAGNOSIS — M542 Cervicalgia: Secondary | ICD-10-CM | POA: Diagnosis not present

## 2015-05-08 DIAGNOSIS — M9901 Segmental and somatic dysfunction of cervical region: Secondary | ICD-10-CM | POA: Diagnosis not present

## 2015-05-08 DIAGNOSIS — M9902 Segmental and somatic dysfunction of thoracic region: Secondary | ICD-10-CM | POA: Diagnosis not present

## 2015-05-11 DIAGNOSIS — M546 Pain in thoracic spine: Secondary | ICD-10-CM | POA: Diagnosis not present

## 2015-05-11 DIAGNOSIS — M9902 Segmental and somatic dysfunction of thoracic region: Secondary | ICD-10-CM | POA: Diagnosis not present

## 2015-05-11 DIAGNOSIS — M9901 Segmental and somatic dysfunction of cervical region: Secondary | ICD-10-CM | POA: Diagnosis not present

## 2015-05-11 DIAGNOSIS — M542 Cervicalgia: Secondary | ICD-10-CM | POA: Diagnosis not present

## 2015-05-15 DIAGNOSIS — M542 Cervicalgia: Secondary | ICD-10-CM | POA: Diagnosis not present

## 2015-05-15 DIAGNOSIS — M546 Pain in thoracic spine: Secondary | ICD-10-CM | POA: Diagnosis not present

## 2015-05-15 DIAGNOSIS — M9901 Segmental and somatic dysfunction of cervical region: Secondary | ICD-10-CM | POA: Diagnosis not present

## 2015-05-15 DIAGNOSIS — M9902 Segmental and somatic dysfunction of thoracic region: Secondary | ICD-10-CM | POA: Diagnosis not present

## 2015-05-22 DIAGNOSIS — M9902 Segmental and somatic dysfunction of thoracic region: Secondary | ICD-10-CM | POA: Diagnosis not present

## 2015-05-22 DIAGNOSIS — M542 Cervicalgia: Secondary | ICD-10-CM | POA: Diagnosis not present

## 2015-05-22 DIAGNOSIS — M9901 Segmental and somatic dysfunction of cervical region: Secondary | ICD-10-CM | POA: Diagnosis not present

## 2015-05-22 DIAGNOSIS — M546 Pain in thoracic spine: Secondary | ICD-10-CM | POA: Diagnosis not present

## 2015-05-25 DIAGNOSIS — M546 Pain in thoracic spine: Secondary | ICD-10-CM | POA: Diagnosis not present

## 2015-05-25 DIAGNOSIS — M9901 Segmental and somatic dysfunction of cervical region: Secondary | ICD-10-CM | POA: Diagnosis not present

## 2015-05-25 DIAGNOSIS — M9902 Segmental and somatic dysfunction of thoracic region: Secondary | ICD-10-CM | POA: Diagnosis not present

## 2015-05-25 DIAGNOSIS — M542 Cervicalgia: Secondary | ICD-10-CM | POA: Diagnosis not present

## 2015-05-29 DIAGNOSIS — M546 Pain in thoracic spine: Secondary | ICD-10-CM | POA: Diagnosis not present

## 2015-05-29 DIAGNOSIS — M9901 Segmental and somatic dysfunction of cervical region: Secondary | ICD-10-CM | POA: Diagnosis not present

## 2015-05-29 DIAGNOSIS — M9902 Segmental and somatic dysfunction of thoracic region: Secondary | ICD-10-CM | POA: Diagnosis not present

## 2015-05-29 DIAGNOSIS — M542 Cervicalgia: Secondary | ICD-10-CM | POA: Diagnosis not present

## 2015-06-05 DIAGNOSIS — M542 Cervicalgia: Secondary | ICD-10-CM | POA: Diagnosis not present

## 2015-06-05 DIAGNOSIS — M546 Pain in thoracic spine: Secondary | ICD-10-CM | POA: Diagnosis not present

## 2015-06-05 DIAGNOSIS — M9902 Segmental and somatic dysfunction of thoracic region: Secondary | ICD-10-CM | POA: Diagnosis not present

## 2015-06-05 DIAGNOSIS — M9901 Segmental and somatic dysfunction of cervical region: Secondary | ICD-10-CM | POA: Diagnosis not present

## 2015-06-08 DIAGNOSIS — M9902 Segmental and somatic dysfunction of thoracic region: Secondary | ICD-10-CM | POA: Diagnosis not present

## 2015-06-08 DIAGNOSIS — M542 Cervicalgia: Secondary | ICD-10-CM | POA: Diagnosis not present

## 2015-06-08 DIAGNOSIS — M546 Pain in thoracic spine: Secondary | ICD-10-CM | POA: Diagnosis not present

## 2015-06-08 DIAGNOSIS — M9901 Segmental and somatic dysfunction of cervical region: Secondary | ICD-10-CM | POA: Diagnosis not present

## 2015-06-15 DIAGNOSIS — M546 Pain in thoracic spine: Secondary | ICD-10-CM | POA: Diagnosis not present

## 2015-06-15 DIAGNOSIS — M9902 Segmental and somatic dysfunction of thoracic region: Secondary | ICD-10-CM | POA: Diagnosis not present

## 2015-06-15 DIAGNOSIS — M542 Cervicalgia: Secondary | ICD-10-CM | POA: Diagnosis not present

## 2015-06-15 DIAGNOSIS — M9901 Segmental and somatic dysfunction of cervical region: Secondary | ICD-10-CM | POA: Diagnosis not present

## 2015-06-22 DIAGNOSIS — Z125 Encounter for screening for malignant neoplasm of prostate: Secondary | ICD-10-CM | POA: Diagnosis not present

## 2015-06-22 DIAGNOSIS — M9902 Segmental and somatic dysfunction of thoracic region: Secondary | ICD-10-CM | POA: Diagnosis not present

## 2015-06-22 DIAGNOSIS — R1013 Epigastric pain: Secondary | ICD-10-CM | POA: Diagnosis not present

## 2015-06-22 DIAGNOSIS — E7800 Pure hypercholesterolemia, unspecified: Secondary | ICD-10-CM | POA: Diagnosis not present

## 2015-06-22 DIAGNOSIS — M546 Pain in thoracic spine: Secondary | ICD-10-CM | POA: Diagnosis not present

## 2015-06-22 DIAGNOSIS — I10 Essential (primary) hypertension: Secondary | ICD-10-CM | POA: Diagnosis not present

## 2015-06-22 DIAGNOSIS — M9901 Segmental and somatic dysfunction of cervical region: Secondary | ICD-10-CM | POA: Diagnosis not present

## 2015-06-22 DIAGNOSIS — E1169 Type 2 diabetes mellitus with other specified complication: Secondary | ICD-10-CM | POA: Diagnosis not present

## 2015-06-22 DIAGNOSIS — M542 Cervicalgia: Secondary | ICD-10-CM | POA: Diagnosis not present

## 2015-06-29 DIAGNOSIS — M542 Cervicalgia: Secondary | ICD-10-CM | POA: Diagnosis not present

## 2015-06-29 DIAGNOSIS — M9902 Segmental and somatic dysfunction of thoracic region: Secondary | ICD-10-CM | POA: Diagnosis not present

## 2015-06-29 DIAGNOSIS — M9901 Segmental and somatic dysfunction of cervical region: Secondary | ICD-10-CM | POA: Diagnosis not present

## 2015-06-29 DIAGNOSIS — M546 Pain in thoracic spine: Secondary | ICD-10-CM | POA: Diagnosis not present

## 2015-09-12 DIAGNOSIS — E1169 Type 2 diabetes mellitus with other specified complication: Secondary | ICD-10-CM | POA: Diagnosis not present

## 2015-09-12 DIAGNOSIS — Z125 Encounter for screening for malignant neoplasm of prostate: Secondary | ICD-10-CM | POA: Diagnosis not present

## 2015-09-12 DIAGNOSIS — I10 Essential (primary) hypertension: Secondary | ICD-10-CM | POA: Diagnosis not present

## 2015-09-21 DIAGNOSIS — E1169 Type 2 diabetes mellitus with other specified complication: Secondary | ICD-10-CM | POA: Diagnosis not present

## 2015-09-21 DIAGNOSIS — I10 Essential (primary) hypertension: Secondary | ICD-10-CM | POA: Diagnosis not present

## 2015-09-21 DIAGNOSIS — R1013 Epigastric pain: Secondary | ICD-10-CM | POA: Diagnosis not present

## 2015-09-21 DIAGNOSIS — E7800 Pure hypercholesterolemia, unspecified: Secondary | ICD-10-CM | POA: Diagnosis not present

## 2015-09-21 DIAGNOSIS — Z125 Encounter for screening for malignant neoplasm of prostate: Secondary | ICD-10-CM | POA: Diagnosis not present

## 2015-09-21 DIAGNOSIS — N529 Male erectile dysfunction, unspecified: Secondary | ICD-10-CM | POA: Diagnosis not present

## 2015-10-04 ENCOUNTER — Other Ambulatory Visit: Payer: Self-pay | Admitting: Dermatology

## 2015-10-04 DIAGNOSIS — L905 Scar conditions and fibrosis of skin: Secondary | ICD-10-CM | POA: Diagnosis not present

## 2015-10-04 DIAGNOSIS — D485 Neoplasm of uncertain behavior of skin: Secondary | ICD-10-CM | POA: Diagnosis not present

## 2015-10-04 DIAGNOSIS — L570 Actinic keratosis: Secondary | ICD-10-CM | POA: Diagnosis not present

## 2015-10-04 DIAGNOSIS — D239 Other benign neoplasm of skin, unspecified: Secondary | ICD-10-CM | POA: Diagnosis not present

## 2015-10-04 DIAGNOSIS — C44722 Squamous cell carcinoma of skin of right lower limb, including hip: Secondary | ICD-10-CM | POA: Diagnosis not present

## 2015-10-04 DIAGNOSIS — C4442 Squamous cell carcinoma of skin of scalp and neck: Secondary | ICD-10-CM | POA: Diagnosis not present

## 2015-10-04 DIAGNOSIS — Z85820 Personal history of malignant melanoma of skin: Secondary | ICD-10-CM | POA: Diagnosis not present

## 2015-10-16 DIAGNOSIS — C44329 Squamous cell carcinoma of skin of other parts of face: Secondary | ICD-10-CM | POA: Diagnosis not present

## 2015-10-16 DIAGNOSIS — C4442 Squamous cell carcinoma of skin of scalp and neck: Secondary | ICD-10-CM | POA: Diagnosis not present

## 2015-10-16 DIAGNOSIS — T814XXA Infection following a procedure, initial encounter: Secondary | ICD-10-CM | POA: Diagnosis not present

## 2015-10-17 DIAGNOSIS — L570 Actinic keratosis: Secondary | ICD-10-CM | POA: Diagnosis not present

## 2015-10-26 DIAGNOSIS — Z85828 Personal history of other malignant neoplasm of skin: Secondary | ICD-10-CM | POA: Diagnosis not present

## 2015-10-26 DIAGNOSIS — L905 Scar conditions and fibrosis of skin: Secondary | ICD-10-CM | POA: Diagnosis not present

## 2015-10-26 DIAGNOSIS — L570 Actinic keratosis: Secondary | ICD-10-CM | POA: Diagnosis not present

## 2015-10-26 DIAGNOSIS — D485 Neoplasm of uncertain behavior of skin: Secondary | ICD-10-CM | POA: Diagnosis not present

## 2015-10-30 DIAGNOSIS — L570 Actinic keratosis: Secondary | ICD-10-CM | POA: Diagnosis not present

## 2015-10-30 DIAGNOSIS — C44622 Squamous cell carcinoma of skin of right upper limb, including shoulder: Secondary | ICD-10-CM | POA: Diagnosis not present

## 2015-11-09 DIAGNOSIS — M9901 Segmental and somatic dysfunction of cervical region: Secondary | ICD-10-CM | POA: Diagnosis not present

## 2015-11-09 DIAGNOSIS — M9902 Segmental and somatic dysfunction of thoracic region: Secondary | ICD-10-CM | POA: Diagnosis not present

## 2015-11-09 DIAGNOSIS — M542 Cervicalgia: Secondary | ICD-10-CM | POA: Diagnosis not present

## 2015-11-09 DIAGNOSIS — M546 Pain in thoracic spine: Secondary | ICD-10-CM | POA: Diagnosis not present

## 2015-11-16 DIAGNOSIS — L820 Inflamed seborrheic keratosis: Secondary | ICD-10-CM | POA: Diagnosis not present

## 2015-11-16 DIAGNOSIS — M9901 Segmental and somatic dysfunction of cervical region: Secondary | ICD-10-CM | POA: Diagnosis not present

## 2015-11-16 DIAGNOSIS — M9902 Segmental and somatic dysfunction of thoracic region: Secondary | ICD-10-CM | POA: Diagnosis not present

## 2015-11-16 DIAGNOSIS — M542 Cervicalgia: Secondary | ICD-10-CM | POA: Diagnosis not present

## 2015-11-16 DIAGNOSIS — M546 Pain in thoracic spine: Secondary | ICD-10-CM | POA: Diagnosis not present

## 2015-11-16 DIAGNOSIS — L570 Actinic keratosis: Secondary | ICD-10-CM | POA: Diagnosis not present

## 2015-11-30 DIAGNOSIS — M542 Cervicalgia: Secondary | ICD-10-CM | POA: Diagnosis not present

## 2015-11-30 DIAGNOSIS — M9902 Segmental and somatic dysfunction of thoracic region: Secondary | ICD-10-CM | POA: Diagnosis not present

## 2015-11-30 DIAGNOSIS — Z23 Encounter for immunization: Secondary | ICD-10-CM | POA: Diagnosis not present

## 2015-11-30 DIAGNOSIS — M546 Pain in thoracic spine: Secondary | ICD-10-CM | POA: Diagnosis not present

## 2015-11-30 DIAGNOSIS — M9901 Segmental and somatic dysfunction of cervical region: Secondary | ICD-10-CM | POA: Diagnosis not present

## 2015-12-06 DIAGNOSIS — L905 Scar conditions and fibrosis of skin: Secondary | ICD-10-CM | POA: Diagnosis not present

## 2015-12-06 DIAGNOSIS — D229 Melanocytic nevi, unspecified: Secondary | ICD-10-CM | POA: Diagnosis not present

## 2015-12-06 DIAGNOSIS — L820 Inflamed seborrheic keratosis: Secondary | ICD-10-CM | POA: Diagnosis not present

## 2015-12-06 DIAGNOSIS — L570 Actinic keratosis: Secondary | ICD-10-CM | POA: Diagnosis not present

## 2015-12-06 DIAGNOSIS — Z85828 Personal history of other malignant neoplasm of skin: Secondary | ICD-10-CM | POA: Diagnosis not present

## 2015-12-06 DIAGNOSIS — C4359 Malignant melanoma of other part of trunk: Secondary | ICD-10-CM | POA: Diagnosis not present

## 2015-12-06 DIAGNOSIS — Z85820 Personal history of malignant melanoma of skin: Secondary | ICD-10-CM | POA: Diagnosis not present

## 2015-12-07 DIAGNOSIS — M542 Cervicalgia: Secondary | ICD-10-CM | POA: Diagnosis not present

## 2015-12-07 DIAGNOSIS — M9901 Segmental and somatic dysfunction of cervical region: Secondary | ICD-10-CM | POA: Diagnosis not present

## 2015-12-07 DIAGNOSIS — M546 Pain in thoracic spine: Secondary | ICD-10-CM | POA: Diagnosis not present

## 2015-12-07 DIAGNOSIS — M9902 Segmental and somatic dysfunction of thoracic region: Secondary | ICD-10-CM | POA: Diagnosis not present

## 2015-12-27 DIAGNOSIS — C4359 Malignant melanoma of other part of trunk: Secondary | ICD-10-CM | POA: Diagnosis not present

## 2016-01-04 DIAGNOSIS — T814XXD Infection following a procedure, subsequent encounter: Secondary | ICD-10-CM | POA: Diagnosis not present

## 2016-01-18 DIAGNOSIS — D485 Neoplasm of uncertain behavior of skin: Secondary | ICD-10-CM | POA: Diagnosis not present

## 2016-01-18 DIAGNOSIS — Z85820 Personal history of malignant melanoma of skin: Secondary | ICD-10-CM | POA: Diagnosis not present

## 2016-01-18 DIAGNOSIS — L570 Actinic keratosis: Secondary | ICD-10-CM | POA: Diagnosis not present

## 2016-01-22 DIAGNOSIS — E1169 Type 2 diabetes mellitus with other specified complication: Secondary | ICD-10-CM | POA: Diagnosis not present

## 2016-01-22 DIAGNOSIS — I10 Essential (primary) hypertension: Secondary | ICD-10-CM | POA: Diagnosis not present

## 2016-01-29 DIAGNOSIS — Z7984 Long term (current) use of oral hypoglycemic drugs: Secondary | ICD-10-CM | POA: Diagnosis not present

## 2016-01-29 DIAGNOSIS — E1169 Type 2 diabetes mellitus with other specified complication: Secondary | ICD-10-CM | POA: Diagnosis not present

## 2016-01-29 DIAGNOSIS — E7800 Pure hypercholesterolemia, unspecified: Secondary | ICD-10-CM | POA: Diagnosis not present

## 2016-01-29 DIAGNOSIS — Z1389 Encounter for screening for other disorder: Secondary | ICD-10-CM | POA: Diagnosis not present

## 2016-01-29 DIAGNOSIS — R1013 Epigastric pain: Secondary | ICD-10-CM | POA: Diagnosis not present

## 2016-01-29 DIAGNOSIS — I10 Essential (primary) hypertension: Secondary | ICD-10-CM | POA: Diagnosis not present

## 2016-01-30 DIAGNOSIS — E119 Type 2 diabetes mellitus without complications: Secondary | ICD-10-CM | POA: Diagnosis not present

## 2016-01-30 DIAGNOSIS — H35372 Puckering of macula, left eye: Secondary | ICD-10-CM | POA: Diagnosis not present

## 2016-01-30 DIAGNOSIS — H40033 Anatomical narrow angle, bilateral: Secondary | ICD-10-CM | POA: Diagnosis not present

## 2016-01-30 DIAGNOSIS — H2513 Age-related nuclear cataract, bilateral: Secondary | ICD-10-CM | POA: Diagnosis not present

## 2016-01-31 DIAGNOSIS — H25812 Combined forms of age-related cataract, left eye: Secondary | ICD-10-CM | POA: Diagnosis not present

## 2016-01-31 DIAGNOSIS — H25811 Combined forms of age-related cataract, right eye: Secondary | ICD-10-CM | POA: Diagnosis not present

## 2016-01-31 DIAGNOSIS — H40033 Anatomical narrow angle, bilateral: Secondary | ICD-10-CM | POA: Diagnosis not present

## 2016-02-08 DIAGNOSIS — M9901 Segmental and somatic dysfunction of cervical region: Secondary | ICD-10-CM | POA: Diagnosis not present

## 2016-02-08 DIAGNOSIS — M542 Cervicalgia: Secondary | ICD-10-CM | POA: Diagnosis not present

## 2016-02-08 DIAGNOSIS — M546 Pain in thoracic spine: Secondary | ICD-10-CM | POA: Diagnosis not present

## 2016-02-08 DIAGNOSIS — M9902 Segmental and somatic dysfunction of thoracic region: Secondary | ICD-10-CM | POA: Diagnosis not present

## 2016-02-12 DIAGNOSIS — Z794 Long term (current) use of insulin: Secondary | ICD-10-CM | POA: Diagnosis not present

## 2016-02-12 DIAGNOSIS — Z01818 Encounter for other preprocedural examination: Secondary | ICD-10-CM | POA: Diagnosis not present

## 2016-02-12 DIAGNOSIS — H269 Unspecified cataract: Secondary | ICD-10-CM | POA: Diagnosis not present

## 2016-02-12 DIAGNOSIS — E1169 Type 2 diabetes mellitus with other specified complication: Secondary | ICD-10-CM | POA: Diagnosis not present

## 2016-02-22 DIAGNOSIS — M9901 Segmental and somatic dysfunction of cervical region: Secondary | ICD-10-CM | POA: Diagnosis not present

## 2016-02-22 DIAGNOSIS — M546 Pain in thoracic spine: Secondary | ICD-10-CM | POA: Diagnosis not present

## 2016-02-22 DIAGNOSIS — M9902 Segmental and somatic dysfunction of thoracic region: Secondary | ICD-10-CM | POA: Diagnosis not present

## 2016-02-22 DIAGNOSIS — M542 Cervicalgia: Secondary | ICD-10-CM | POA: Diagnosis not present

## 2020-04-19 DIAGNOSIS — E119 Type 2 diabetes mellitus without complications: Secondary | ICD-10-CM | POA: Diagnosis not present

## 2020-04-19 DIAGNOSIS — Z0189 Encounter for other specified special examinations: Secondary | ICD-10-CM | POA: Diagnosis not present

## 2020-04-19 DIAGNOSIS — E782 Mixed hyperlipidemia: Secondary | ICD-10-CM | POA: Diagnosis not present

## 2020-04-19 DIAGNOSIS — I2510 Atherosclerotic heart disease of native coronary artery without angina pectoris: Secondary | ICD-10-CM | POA: Diagnosis not present

## 2020-04-19 DIAGNOSIS — I10 Essential (primary) hypertension: Secondary | ICD-10-CM | POA: Diagnosis not present

## 2020-04-19 DIAGNOSIS — Z85820 Personal history of malignant melanoma of skin: Secondary | ICD-10-CM | POA: Diagnosis not present

## 2020-04-19 DIAGNOSIS — R42 Dizziness and giddiness: Secondary | ICD-10-CM | POA: Diagnosis not present

## 2020-05-17 ENCOUNTER — Encounter: Payer: Self-pay | Admitting: *Deleted

## 2020-05-17 HISTORY — PX: MOHS SURGERY: SUR867
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
## 2020-05-18 ENCOUNTER — Other Ambulatory Visit: Payer: Self-pay

## 2020-05-18 ENCOUNTER — Encounter: Payer: Self-pay | Admitting: Cardiology

## 2020-05-18 ENCOUNTER — Ambulatory Visit (INDEPENDENT_AMBULATORY_CARE_PROVIDER_SITE_OTHER): Payer: Medicare HMO | Admitting: Cardiology

## 2020-05-18 ENCOUNTER — Encounter: Payer: Self-pay | Admitting: *Deleted

## 2020-05-18 VITALS — BP 140/64 | HR 76 | Ht 66.0 in | Wt 152.4 lb

## 2020-05-18 DIAGNOSIS — R079 Chest pain, unspecified: Secondary | ICD-10-CM

## 2020-05-18 DIAGNOSIS — I10 Essential (primary) hypertension: Secondary | ICD-10-CM | POA: Diagnosis not present

## 2020-05-18 DIAGNOSIS — I6523 Occlusion and stenosis of bilateral carotid arteries: Secondary | ICD-10-CM | POA: Diagnosis not present

## 2020-05-18 NOTE — Patient Instructions (Signed)
Your physician recommends that you schedule a follow-up appointment in: AS NEEDED   Your physician recommends that you continue on your current medications as directed. Please refer to the Current Medication list given to you today.  Your physician has requested that you have a lexiscan myoview. For further information please visit [email or web address]. Please follow instruction sheet, as given.  Your physician has requested that you have a carotid duplex. This test is an ultrasound of the carotid arteries in your neck. It looks at blood flow through these arteries that supply the brain with blood. Allow one hour for this exam. There are no restrictions or special instructions.  Thank you for choosing [facility]!!

## 2020-05-23 ENCOUNTER — Telehealth: Payer: Self-pay | Admitting: Cardiology

## 2020-05-23 DIAGNOSIS — R931 Abnormal findings on diagnostic imaging of heart and coronary circulation: Secondary | ICD-10-CM

## 2020-05-23 NOTE — Telephone Encounter (Signed)
Pre-cert Verification for the following procedure    LEXISCAN    DATE: 05/29/2020  LOCATION: [facility]

## 2020-05-29 ENCOUNTER — Encounter (HOSPITAL_COMMUNITY)
Admission: RE | Admit: 2020-05-29 | Discharge: 2020-05-29 | Disposition: A | Discharge status: Home / Self Care | Payer: Medicare HMO | Source: Ambulatory Visit | Attending: Cardiology | Admitting: Cardiology

## 2020-05-29 ENCOUNTER — Encounter (HOSPITAL_BASED_OUTPATIENT_CLINIC_OR_DEPARTMENT_OTHER)
Admission: RE | Admit: 2020-05-29 | Discharge: 2020-05-29 | Disposition: A | Discharge status: Home / Self Care | Payer: Medicare HMO | Source: Ambulatory Visit | Attending: Cardiology | Admitting: Cardiology

## 2020-05-29 DIAGNOSIS — R079 Chest pain, unspecified: Secondary | ICD-10-CM | POA: Insufficient documentation

## 2020-05-29 LAB — NM MYOCAR MULTI W/SPECT W/WALL MOTION / EF
LV dias vol: 90 mL (ref 62–150)
LV sys vol: 47 mL
Peak HR: 95 {beats}/min
RATE: 0.49
Rest HR: 67 {beats}/min
SDS: 5
SRS: 21
SSS: 26
TID: 1.12

## 2020-05-29 MED ORDER — TECHNETIUM TC 99M TETROFOSMIN IV KIT
10.0000 | PACK | Freq: Once | INTRAVENOUS | Status: AC | PRN
  Administered 2020-05-29: 9 via INTRAVENOUS

## 2020-05-29 MED ORDER — SODIUM CHLORIDE FLUSH 0.9 % IV SOLN
INTRAVENOUS | Status: AC
  Administered 2020-05-29: 10 mL via INTRAVENOUS
  Filled 2020-05-29: qty 10

## 2020-05-29 MED ORDER — TECHNETIUM TC 99M TETROFOSMIN IV KIT
30.0000 | PACK | Freq: Once | INTRAVENOUS | Status: AC | PRN
  Administered 2020-05-29: 30.2 via INTRAVENOUS

## 2020-05-29 MED ORDER — REGADENOSON 0.4 MG/5ML IV SOLN
INTRAVENOUS | Status: AC
  Administered 2020-05-29: 0.4 mg via INTRAVENOUS
  Filled 2020-05-29: qty 5

## 2020-06-01 ENCOUNTER — Telehealth: Payer: Self-pay | Admitting: Cardiology

## 2020-06-01 NOTE — Telephone Encounter (Signed)
New message   Patient wife call for stress test results

## 2020-06-01 NOTE — Telephone Encounter (Signed)
Advised that provider has not resulted on stress test yet and they would be contacted once provider reviews result. Verbalized understanding.

## 2020-06-06 NOTE — Telephone Encounter (Signed)
  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 I would like to get an echocardiogram and see him back after that test. Call Mr. Cuervo with the results and send results to Celene Squibb, MD     I spoke with patient and gave him results. I placed order for echo and V.Slaughter from the [facility] office will call to schedule both echo and follow up appointment with Dr.Hochrein.    Dr.Hall copied

## 2020-06-06 NOTE — Telephone Encounter (Signed)
Patient calling for results for stress test

## 2020-06-12 ENCOUNTER — Ambulatory Visit (INDEPENDENT_AMBULATORY_CARE_PROVIDER_SITE_OTHER): Payer: Medicare HMO

## 2020-06-12 DIAGNOSIS — R079 Chest pain, unspecified: Secondary | ICD-10-CM | POA: Diagnosis not present

## 2020-06-12 DIAGNOSIS — I6523 Occlusion and stenosis of bilateral carotid arteries: Secondary | ICD-10-CM

## 2020-06-12 DIAGNOSIS — R931 Abnormal findings on diagnostic imaging of heart and coronary circulation: Secondary | ICD-10-CM

## 2020-06-13 ENCOUNTER — Telehealth: Payer: Self-pay | Admitting: *Deleted

## 2020-06-13 LAB — ECHOCARDIOGRAM COMPLETE
AR max vel: 1.68 cm2
AV Area VTI: 1.93 cm2
AV Area mean vel: 1.63 cm2
AV Mean grad: 7 mmHg
AV Peak grad: 13.2 mmHg
AV Vena cont: 0.27 cm
Ao pk vel: 1.82 m/s
Area-P 1/2: 3.99 cm2
Calc EF: 70.9 %
MV M vel: 4.82 m/s
MV Peak grad: 92.9 mmHg
P 1/2 time: 507 msec
S' Lateral: 2.82 cm
Single Plane A2C EF: 73.9 %
Single Plane A4C EF: 68.7 %

## 2020-06-13 NOTE — Telephone Encounter (Signed)
-----   Message from Earvin Hansen, LPN sent at 1/48/3073  1:19 PM EDT ----- Patient seen at [facility] office, sent to [place]

## 2020-06-13 NOTE — Telephone Encounter (Signed)
Patient informed. Copy sent to PCP °

## 2020-07-14 ENCOUNTER — Telehealth: Payer: Self-pay | Admitting: Cardiology

## 2020-07-14 NOTE — Telephone Encounter (Signed)
Patient's wife returning call for echo results.

## 2020-07-14 NOTE — Telephone Encounter (Signed)
This RN called patient back, patient answered and asked this RN to please tell echo results to his wife Kenneth Parsons (also ok per DPR). This RN then relayed Dr. Rosezella [place] result note:  Minus Breeding, MD  06/18/2020 10:21 AM EDT      The EF is normal. There is mild AS and AI. No wall motion abnormalities to suggest previous MI as suggested on perfusion imaging. Call Kenneth Parsons with the results and send results to Celene Squibb, MD. He is to have follow up with me to discuss further.   Patient's spouse verbalized understanding. Patient does not currently have a follow up appointment, this RN to forward message to Dr. Percival Spanish and scheduling team to find an appointment for the patient.

## 2020-07-19 ENCOUNTER — Other Ambulatory Visit: Payer: Medicare HMO

## 2020-08-02 DIAGNOSIS — E782 Mixed hyperlipidemia: Secondary | ICD-10-CM | POA: Diagnosis not present

## 2020-08-02 DIAGNOSIS — I10 Essential (primary) hypertension: Secondary | ICD-10-CM | POA: Diagnosis not present

## 2020-08-15 DIAGNOSIS — R42 Dizziness and giddiness: Secondary | ICD-10-CM | POA: Diagnosis not present

## 2020-08-15 DIAGNOSIS — N182 Chronic kidney disease, stage 2 (mild): Secondary | ICD-10-CM | POA: Diagnosis not present

## 2020-08-15 DIAGNOSIS — F4310 Post-traumatic stress disorder, unspecified: Secondary | ICD-10-CM | POA: Diagnosis not present

## 2020-08-15 DIAGNOSIS — I10 Essential (primary) hypertension: Secondary | ICD-10-CM | POA: Diagnosis not present

## 2020-08-15 DIAGNOSIS — R0789 Other chest pain: Secondary | ICD-10-CM | POA: Diagnosis not present

## 2020-08-15 DIAGNOSIS — I2510 Atherosclerotic heart disease of native coronary artery without angina pectoris: Secondary | ICD-10-CM | POA: Diagnosis not present

## 2020-08-15 DIAGNOSIS — Z85828 Personal history of other malignant neoplasm of skin: Secondary | ICD-10-CM | POA: Diagnosis not present

## 2020-08-15 DIAGNOSIS — E1165 Type 2 diabetes mellitus with hyperglycemia: Secondary | ICD-10-CM | POA: Diagnosis not present

## 2020-08-15 DIAGNOSIS — E782 Mixed hyperlipidemia: Secondary | ICD-10-CM | POA: Diagnosis not present
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
## 2020-08-16 ENCOUNTER — Encounter: Payer: Self-pay | Admitting: Cardiology

## 2020-08-16 ENCOUNTER — Ambulatory Visit: Payer: Medicare HMO | Admitting: Cardiology

## 2020-08-16 ENCOUNTER — Telehealth: Payer: Self-pay | Admitting: *Deleted

## 2020-08-16 VITALS — BP 131/57 | HR 59 | Ht 66.0 in | Wt 154.2 lb

## 2020-08-16 DIAGNOSIS — I10 Essential (primary) hypertension: Secondary | ICD-10-CM

## 2020-08-16 DIAGNOSIS — R072 Precordial pain: Secondary | ICD-10-CM

## 2020-08-16 DIAGNOSIS — I6529 Occlusion and stenosis of unspecified carotid artery: Secondary | ICD-10-CM

## 2020-08-16 DIAGNOSIS — E118 Type 2 diabetes mellitus with unspecified complications: Secondary | ICD-10-CM | POA: Diagnosis not present

## 2020-08-16 NOTE — Patient Instructions (Addendum)
Medication Instructions:  Your physician recommends that you continue on your current medications as directed. Please refer to the Current Medication list given to you today.  Labwork: Call office with your LDL number-[phone]  Testing/Procedures: none  Follow-Up: Your physician recommends that you schedule a follow-up appointment in: 1 year. You will receive a reminder call or letter in the mail in about 10 months reminding you to call and schedule your appointment. If you don't receive this letter, please contact our office.  Any Other Special Instructions Will Be Listed Below (If Applicable).  If you need a refill on your cardiac medications before your next appointment, please call your pharmacy.

## 2020-08-16 NOTE — Telephone Encounter (Signed)
Wife called to report LDL is 63

## 2020-11-16 DIAGNOSIS — I10 Essential (primary) hypertension: Secondary | ICD-10-CM | POA: Diagnosis not present

## 2020-11-16 DIAGNOSIS — E119 Type 2 diabetes mellitus without complications: Secondary | ICD-10-CM | POA: Diagnosis not present

## 2020-11-20 DIAGNOSIS — E1165 Type 2 diabetes mellitus with hyperglycemia: Secondary | ICD-10-CM | POA: Diagnosis not present

## 2020-11-20 DIAGNOSIS — F4310 Post-traumatic stress disorder, unspecified: Secondary | ICD-10-CM | POA: Diagnosis not present

## 2020-11-20 DIAGNOSIS — Z23 Encounter for immunization: Secondary | ICD-10-CM | POA: Diagnosis not present

## 2020-11-20 DIAGNOSIS — I2510 Atherosclerotic heart disease of native coronary artery without angina pectoris: Secondary | ICD-10-CM | POA: Diagnosis not present

## 2020-11-20 DIAGNOSIS — E782 Mixed hyperlipidemia: Secondary | ICD-10-CM | POA: Diagnosis not present

## 2020-11-20 DIAGNOSIS — N182 Chronic kidney disease, stage 2 (mild): Secondary | ICD-10-CM | POA: Diagnosis not present

## 2020-11-20 DIAGNOSIS — R0789 Other chest pain: Secondary | ICD-10-CM | POA: Diagnosis not present

## 2020-11-20 DIAGNOSIS — Z85828 Personal history of other malignant neoplasm of skin: Secondary | ICD-10-CM | POA: Diagnosis not present

## 2020-11-20 DIAGNOSIS — I10 Essential (primary) hypertension: Secondary | ICD-10-CM | POA: Diagnosis not present

## 2021-02-28 DIAGNOSIS — E083213 Diabetes mellitus due to underlying condition with mild nonproliferative diabetic retinopathy with macular edema, bilateral: Secondary | ICD-10-CM | POA: Diagnosis not present

## 2021-03-16 DIAGNOSIS — E782 Mixed hyperlipidemia: Secondary | ICD-10-CM | POA: Diagnosis not present

## 2021-03-16 DIAGNOSIS — E1165 Type 2 diabetes mellitus with hyperglycemia: Secondary | ICD-10-CM | POA: Diagnosis not present

## 2021-03-21 DIAGNOSIS — I2510 Atherosclerotic heart disease of native coronary artery without angina pectoris: Secondary | ICD-10-CM | POA: Diagnosis not present

## 2021-03-21 DIAGNOSIS — N182 Chronic kidney disease, stage 2 (mild): Secondary | ICD-10-CM | POA: Diagnosis not present

## 2021-03-21 DIAGNOSIS — E1165 Type 2 diabetes mellitus with hyperglycemia: Secondary | ICD-10-CM | POA: Diagnosis not present

## 2021-03-21 DIAGNOSIS — E782 Mixed hyperlipidemia: Secondary | ICD-10-CM | POA: Diagnosis not present

## 2021-03-21 DIAGNOSIS — F4310 Post-traumatic stress disorder, unspecified: Secondary | ICD-10-CM | POA: Diagnosis not present

## 2021-03-21 DIAGNOSIS — R0789 Other chest pain: Secondary | ICD-10-CM | POA: Diagnosis not present

## 2021-03-21 DIAGNOSIS — Z85828 Personal history of other malignant neoplasm of skin: Secondary | ICD-10-CM | POA: Diagnosis not present

## 2021-03-21 DIAGNOSIS — I10 Essential (primary) hypertension: Secondary | ICD-10-CM | POA: Diagnosis not present

## 2021-03-21 DIAGNOSIS — C439 Malignant melanoma of skin, unspecified: Secondary | ICD-10-CM | POA: Diagnosis not present

## 2021-06-20 DIAGNOSIS — E782 Mixed hyperlipidemia: Secondary | ICD-10-CM | POA: Diagnosis not present

## 2021-06-20 DIAGNOSIS — E1165 Type 2 diabetes mellitus with hyperglycemia: Secondary | ICD-10-CM | POA: Diagnosis not present

## 2021-06-26 DIAGNOSIS — I2510 Atherosclerotic heart disease of native coronary artery without angina pectoris: Secondary | ICD-10-CM | POA: Diagnosis not present

## 2021-06-26 DIAGNOSIS — F4310 Post-traumatic stress disorder, unspecified: Secondary | ICD-10-CM | POA: Diagnosis not present

## 2021-06-26 DIAGNOSIS — N182 Chronic kidney disease, stage 2 (mild): Secondary | ICD-10-CM | POA: Diagnosis not present

## 2021-06-26 DIAGNOSIS — E782 Mixed hyperlipidemia: Secondary | ICD-10-CM | POA: Diagnosis not present

## 2021-06-26 DIAGNOSIS — I10 Essential (primary) hypertension: Secondary | ICD-10-CM | POA: Diagnosis not present

## 2021-06-26 DIAGNOSIS — C439 Malignant melanoma of skin, unspecified: Secondary | ICD-10-CM | POA: Diagnosis not present

## 2021-06-26 DIAGNOSIS — E1165 Type 2 diabetes mellitus with hyperglycemia: Secondary | ICD-10-CM | POA: Diagnosis not present

## 2021-06-26 DIAGNOSIS — R0789 Other chest pain: Secondary | ICD-10-CM | POA: Diagnosis not present

## 2021-06-26 DIAGNOSIS — Z85828 Personal history of other malignant neoplasm of skin: Secondary | ICD-10-CM | POA: Diagnosis not present

## 2021-10-15 DIAGNOSIS — E1165 Type 2 diabetes mellitus with hyperglycemia: Secondary | ICD-10-CM | POA: Diagnosis not present

## 2021-10-15 DIAGNOSIS — E782 Mixed hyperlipidemia: Secondary | ICD-10-CM | POA: Diagnosis not present

## 2021-10-22 DIAGNOSIS — R0789 Other chest pain: Secondary | ICD-10-CM | POA: Diagnosis not present

## 2021-10-22 DIAGNOSIS — E782 Mixed hyperlipidemia: Secondary | ICD-10-CM | POA: Diagnosis not present

## 2021-10-22 DIAGNOSIS — C439 Malignant melanoma of skin, unspecified: Secondary | ICD-10-CM | POA: Diagnosis not present

## 2021-10-22 DIAGNOSIS — Z85828 Personal history of other malignant neoplasm of skin: Secondary | ICD-10-CM | POA: Diagnosis not present

## 2021-10-22 DIAGNOSIS — N182 Chronic kidney disease, stage 2 (mild): Secondary | ICD-10-CM | POA: Diagnosis not present

## 2021-10-22 DIAGNOSIS — I2510 Atherosclerotic heart disease of native coronary artery without angina pectoris: Secondary | ICD-10-CM | POA: Diagnosis not present

## 2021-10-22 DIAGNOSIS — E1165 Type 2 diabetes mellitus with hyperglycemia: Secondary | ICD-10-CM | POA: Diagnosis not present

## 2021-10-22 DIAGNOSIS — I10 Essential (primary) hypertension: Secondary | ICD-10-CM | POA: Diagnosis not present

## 2021-10-22 DIAGNOSIS — F4310 Post-traumatic stress disorder, unspecified: Secondary | ICD-10-CM | POA: Diagnosis not present

## 2022-02-20 DIAGNOSIS — E1165 Type 2 diabetes mellitus with hyperglycemia: Secondary | ICD-10-CM | POA: Diagnosis not present

## 2022-02-20 DIAGNOSIS — E782 Mixed hyperlipidemia: Secondary | ICD-10-CM | POA: Diagnosis not present

## 2022-02-26 DIAGNOSIS — Z0000 Encounter for general adult medical examination without abnormal findings: Secondary | ICD-10-CM | POA: Diagnosis not present

## 2022-02-26 DIAGNOSIS — I10 Essential (primary) hypertension: Secondary | ICD-10-CM | POA: Diagnosis not present

## 2022-02-26 DIAGNOSIS — E782 Mixed hyperlipidemia: Secondary | ICD-10-CM | POA: Diagnosis not present

## 2022-02-26 DIAGNOSIS — I129 Hypertensive chronic kidney disease with stage 1 through stage 4 chronic kidney disease, or unspecified chronic kidney disease: Secondary | ICD-10-CM | POA: Diagnosis not present

## 2022-02-26 DIAGNOSIS — R0789 Other chest pain: Secondary | ICD-10-CM | POA: Diagnosis not present

## 2022-02-26 DIAGNOSIS — E1165 Type 2 diabetes mellitus with hyperglycemia: Secondary | ICD-10-CM | POA: Diagnosis not present

## 2022-02-26 DIAGNOSIS — E1122 Type 2 diabetes mellitus with diabetic chronic kidney disease: Secondary | ICD-10-CM | POA: Diagnosis not present

## 2022-02-26 DIAGNOSIS — F4310 Post-traumatic stress disorder, unspecified: Secondary | ICD-10-CM | POA: Diagnosis not present

## 2022-02-26 DIAGNOSIS — N182 Chronic kidney disease, stage 2 (mild): Secondary | ICD-10-CM | POA: Diagnosis not present

## 2022-03-13 DIAGNOSIS — E119 Type 2 diabetes mellitus without complications: Secondary | ICD-10-CM | POA: Diagnosis not present

## 2022-04-17 DIAGNOSIS — H04123 Dry eye syndrome of bilateral lacrimal glands: Secondary | ICD-10-CM | POA: Diagnosis not present

## 2022-04-17 DIAGNOSIS — H26493 Other secondary cataract, bilateral: Secondary | ICD-10-CM | POA: Diagnosis not present

## 2022-06-03 DIAGNOSIS — E1165 Type 2 diabetes mellitus with hyperglycemia: Secondary | ICD-10-CM | POA: Diagnosis not present

## 2022-06-03 DIAGNOSIS — E782 Mixed hyperlipidemia: Secondary | ICD-10-CM | POA: Diagnosis not present

## 2022-06-04 LAB — LAB REPORT - SCANNED
A1c: 7.4
Albumin, Urine POC: 4.8
Creatinine, POC: 78.8 mg/dL
EGFR: 73
Microalb Creat Ratio: 6

## 2022-06-07 DIAGNOSIS — E1122 Type 2 diabetes mellitus with diabetic chronic kidney disease: Secondary | ICD-10-CM | POA: Diagnosis not present

## 2022-06-07 DIAGNOSIS — I6529 Occlusion and stenosis of unspecified carotid artery: Secondary | ICD-10-CM | POA: Diagnosis not present

## 2022-06-07 DIAGNOSIS — E1165 Type 2 diabetes mellitus with hyperglycemia: Secondary | ICD-10-CM | POA: Diagnosis not present

## 2022-06-07 DIAGNOSIS — E1169 Type 2 diabetes mellitus with other specified complication: Secondary | ICD-10-CM | POA: Diagnosis not present

## 2022-06-07 DIAGNOSIS — R69 Illness, unspecified: Secondary | ICD-10-CM | POA: Diagnosis not present

## 2022-06-07 DIAGNOSIS — R0789 Other chest pain: Secondary | ICD-10-CM | POA: Diagnosis not present

## 2022-06-07 DIAGNOSIS — N182 Chronic kidney disease, stage 2 (mild): Secondary | ICD-10-CM | POA: Diagnosis not present

## 2022-06-07 DIAGNOSIS — E663 Overweight: Secondary | ICD-10-CM | POA: Diagnosis not present

## 2022-06-07 DIAGNOSIS — I10 Essential (primary) hypertension: Secondary | ICD-10-CM | POA: Diagnosis not present

## 2022-06-07 DIAGNOSIS — E782 Mixed hyperlipidemia: Secondary | ICD-10-CM | POA: Diagnosis not present

## 2022-06-07 DIAGNOSIS — I2510 Atherosclerotic heart disease of native coronary artery without angina pectoris: Secondary | ICD-10-CM | POA: Diagnosis not present

## 2022-06-07 DIAGNOSIS — E875 Hyperkalemia: Secondary | ICD-10-CM | POA: Diagnosis not present

## 2022-09-10 DIAGNOSIS — E1165 Type 2 diabetes mellitus with hyperglycemia: Secondary | ICD-10-CM | POA: Diagnosis not present

## 2022-09-10 DIAGNOSIS — E782 Mixed hyperlipidemia: Secondary | ICD-10-CM | POA: Diagnosis not present

## 2022-09-12 DIAGNOSIS — M25512 Pain in left shoulder: Secondary | ICD-10-CM | POA: Diagnosis not present

## 2022-09-12 DIAGNOSIS — I2510 Atherosclerotic heart disease of native coronary artery without angina pectoris: Secondary | ICD-10-CM | POA: Diagnosis not present

## 2022-09-12 DIAGNOSIS — F4310 Post-traumatic stress disorder, unspecified: Secondary | ICD-10-CM | POA: Diagnosis not present

## 2022-09-12 DIAGNOSIS — R0789 Other chest pain: Secondary | ICD-10-CM | POA: Diagnosis not present

## 2022-09-12 DIAGNOSIS — E1122 Type 2 diabetes mellitus with diabetic chronic kidney disease: Secondary | ICD-10-CM | POA: Diagnosis not present

## 2022-09-12 DIAGNOSIS — N182 Chronic kidney disease, stage 2 (mild): Secondary | ICD-10-CM | POA: Diagnosis not present

## 2022-09-12 DIAGNOSIS — I129 Hypertensive chronic kidney disease with stage 1 through stage 4 chronic kidney disease, or unspecified chronic kidney disease: Secondary | ICD-10-CM | POA: Diagnosis not present

## 2022-09-12 DIAGNOSIS — E1165 Type 2 diabetes mellitus with hyperglycemia: Secondary | ICD-10-CM | POA: Diagnosis not present

## 2022-09-12 DIAGNOSIS — E782 Mixed hyperlipidemia: Secondary | ICD-10-CM | POA: Diagnosis not present

## 2022-09-12 DIAGNOSIS — C439 Malignant melanoma of skin, unspecified: Secondary | ICD-10-CM | POA: Diagnosis not present

## 2022-09-12 DIAGNOSIS — Z85828 Personal history of other malignant neoplasm of skin: Secondary | ICD-10-CM | POA: Diagnosis not present

## 2022-09-12 DIAGNOSIS — I10 Essential (primary) hypertension: Secondary | ICD-10-CM | POA: Diagnosis not present

## 2022-12-17 DIAGNOSIS — E782 Mixed hyperlipidemia: Secondary | ICD-10-CM | POA: Diagnosis not present

## 2022-12-17 DIAGNOSIS — E1165 Type 2 diabetes mellitus with hyperglycemia: Secondary | ICD-10-CM | POA: Diagnosis not present

## 2022-12-23 DIAGNOSIS — I10 Essential (primary) hypertension: Secondary | ICD-10-CM | POA: Diagnosis not present

## 2022-12-23 DIAGNOSIS — N182 Chronic kidney disease, stage 2 (mild): Secondary | ICD-10-CM | POA: Diagnosis not present

## 2022-12-23 DIAGNOSIS — E1165 Type 2 diabetes mellitus with hyperglycemia: Secondary | ICD-10-CM | POA: Diagnosis not present

## 2022-12-23 DIAGNOSIS — F4310 Post-traumatic stress disorder, unspecified: Secondary | ICD-10-CM | POA: Diagnosis not present

## 2022-12-23 DIAGNOSIS — Z23 Encounter for immunization: Secondary | ICD-10-CM | POA: Diagnosis not present

## 2022-12-23 DIAGNOSIS — C439 Malignant melanoma of skin, unspecified: Secondary | ICD-10-CM | POA: Diagnosis not present

## 2022-12-23 DIAGNOSIS — E1169 Type 2 diabetes mellitus with other specified complication: Secondary | ICD-10-CM | POA: Diagnosis not present

## 2022-12-23 DIAGNOSIS — E782 Mixed hyperlipidemia: Secondary | ICD-10-CM | POA: Diagnosis not present

## 2022-12-23 DIAGNOSIS — E1159 Type 2 diabetes mellitus with other circulatory complications: Secondary | ICD-10-CM | POA: Diagnosis not present

## 2022-12-23 DIAGNOSIS — I2510 Atherosclerotic heart disease of native coronary artery without angina pectoris: Secondary | ICD-10-CM | POA: Diagnosis not present

## 2022-12-23 DIAGNOSIS — I129 Hypertensive chronic kidney disease with stage 1 through stage 4 chronic kidney disease, or unspecified chronic kidney disease: Secondary | ICD-10-CM | POA: Diagnosis not present

## 2022-12-23 DIAGNOSIS — E1122 Type 2 diabetes mellitus with diabetic chronic kidney disease: Secondary | ICD-10-CM | POA: Diagnosis not present

## 2023-03-17 DIAGNOSIS — E119 Type 2 diabetes mellitus without complications: Secondary | ICD-10-CM | POA: Diagnosis not present

## 2023-04-18 DIAGNOSIS — E782 Mixed hyperlipidemia: Secondary | ICD-10-CM | POA: Diagnosis not present

## 2023-04-18 DIAGNOSIS — E1165 Type 2 diabetes mellitus with hyperglycemia: Secondary | ICD-10-CM | POA: Diagnosis not present

## 2023-05-09 DIAGNOSIS — Z85828 Personal history of other malignant neoplasm of skin: Secondary | ICD-10-CM | POA: Diagnosis not present

## 2023-05-09 DIAGNOSIS — E1165 Type 2 diabetes mellitus with hyperglycemia: Secondary | ICD-10-CM | POA: Diagnosis not present

## 2023-05-09 DIAGNOSIS — I129 Hypertensive chronic kidney disease with stage 1 through stage 4 chronic kidney disease, or unspecified chronic kidney disease: Secondary | ICD-10-CM | POA: Diagnosis not present

## 2023-05-09 DIAGNOSIS — F4310 Post-traumatic stress disorder, unspecified: Secondary | ICD-10-CM | POA: Diagnosis not present

## 2023-05-09 DIAGNOSIS — N182 Chronic kidney disease, stage 2 (mild): Secondary | ICD-10-CM | POA: Diagnosis not present

## 2023-05-09 DIAGNOSIS — I2510 Atherosclerotic heart disease of native coronary artery without angina pectoris: Secondary | ICD-10-CM | POA: Diagnosis not present

## 2023-05-09 DIAGNOSIS — E1169 Type 2 diabetes mellitus with other specified complication: Secondary | ICD-10-CM | POA: Diagnosis not present

## 2023-05-09 DIAGNOSIS — I10 Essential (primary) hypertension: Secondary | ICD-10-CM | POA: Diagnosis not present

## 2023-05-09 DIAGNOSIS — E782 Mixed hyperlipidemia: Secondary | ICD-10-CM | POA: Diagnosis not present

## 2023-10-06 DIAGNOSIS — C311 Malignant neoplasm of ethmoidal sinus: Secondary | ICD-10-CM | POA: Diagnosis not present

## 2023-10-06 DIAGNOSIS — F411 Generalized anxiety disorder: Secondary | ICD-10-CM | POA: Diagnosis not present

## 2023-10-06 DIAGNOSIS — I10 Essential (primary) hypertension: Secondary | ICD-10-CM | POA: Diagnosis not present

## 2023-10-06 DIAGNOSIS — I6529 Occlusion and stenosis of unspecified carotid artery: Secondary | ICD-10-CM | POA: Diagnosis not present

## 2023-10-06 DIAGNOSIS — N182 Chronic kidney disease, stage 2 (mild): Secondary | ICD-10-CM | POA: Diagnosis not present

## 2023-10-06 DIAGNOSIS — E119 Type 2 diabetes mellitus without complications: Secondary | ICD-10-CM | POA: Diagnosis not present

## 2023-10-06 DIAGNOSIS — Z85828 Personal history of other malignant neoplasm of skin: Secondary | ICD-10-CM | POA: Diagnosis not present

## 2023-10-06 DIAGNOSIS — E782 Mixed hyperlipidemia: Secondary | ICD-10-CM | POA: Diagnosis not present

## 2023-10-06 DIAGNOSIS — Z794 Long term (current) use of insulin: Secondary | ICD-10-CM | POA: Diagnosis not present

## 2023-12-15 DIAGNOSIS — F411 Generalized anxiety disorder: Secondary | ICD-10-CM | POA: Diagnosis not present

## 2023-12-15 DIAGNOSIS — Z630 Problems in relationship with spouse or partner: Secondary | ICD-10-CM | POA: Diagnosis not present
